# Patient Record
Sex: Male | Born: 1955 | Race: White | Hispanic: No | Marital: Married
Health system: Southern US, Community
[De-identification: ages and names within clinical notes are randomized; demographics above are authoritative.]

---

## 2004-01-13 ENCOUNTER — Other Ambulatory Visit: Payer: Self-pay

## 2004-01-14 ENCOUNTER — Inpatient Hospital Stay: Payer: Self-pay | Admitting: General Surgery

## 2004-01-15 ENCOUNTER — Other Ambulatory Visit: Payer: Self-pay

## 2004-01-16 ENCOUNTER — Other Ambulatory Visit: Payer: Self-pay

## 2004-01-24 ENCOUNTER — Other Ambulatory Visit: Payer: Self-pay

## 2004-02-06 ENCOUNTER — Ambulatory Visit: Payer: Self-pay | Admitting: General Surgery

## 2004-02-07 ENCOUNTER — Ambulatory Visit: Payer: Self-pay | Admitting: Internal Medicine

## 2004-02-26 ENCOUNTER — Ambulatory Visit: Payer: Self-pay | Admitting: Internal Medicine

## 2004-03-07 ENCOUNTER — Ambulatory Visit: Payer: Self-pay | Admitting: General Surgery

## 2004-03-09 ENCOUNTER — Ambulatory Visit: Payer: Self-pay | Admitting: General Surgery

## 2004-03-28 ENCOUNTER — Ambulatory Visit: Payer: Self-pay | Admitting: Internal Medicine

## 2004-04-25 ENCOUNTER — Ambulatory Visit: Payer: Self-pay | Admitting: Internal Medicine

## 2004-05-26 ENCOUNTER — Ambulatory Visit: Payer: Self-pay | Admitting: Internal Medicine

## 2004-06-25 ENCOUNTER — Ambulatory Visit: Payer: Self-pay | Admitting: Internal Medicine

## 2004-07-26 ENCOUNTER — Ambulatory Visit: Payer: Self-pay | Admitting: Internal Medicine

## 2004-08-10 ENCOUNTER — Ambulatory Visit: Payer: Self-pay | Admitting: Neurology

## 2004-08-25 ENCOUNTER — Ambulatory Visit: Payer: Self-pay | Admitting: Internal Medicine

## 2004-09-25 ENCOUNTER — Ambulatory Visit: Payer: Self-pay | Admitting: Internal Medicine

## 2004-10-26 ENCOUNTER — Ambulatory Visit: Payer: Self-pay | Admitting: Internal Medicine

## 2004-11-25 ENCOUNTER — Ambulatory Visit: Payer: Self-pay | Admitting: Internal Medicine

## 2004-12-26 ENCOUNTER — Ambulatory Visit: Payer: Self-pay | Admitting: Internal Medicine

## 2005-01-25 ENCOUNTER — Ambulatory Visit: Payer: Self-pay | Admitting: Internal Medicine

## 2005-02-25 ENCOUNTER — Ambulatory Visit: Payer: Self-pay | Admitting: Internal Medicine

## 2005-03-20 ENCOUNTER — Other Ambulatory Visit: Payer: Self-pay

## 2005-03-28 ENCOUNTER — Ambulatory Visit: Payer: Self-pay | Admitting: Internal Medicine

## 2005-04-25 ENCOUNTER — Ambulatory Visit: Payer: Self-pay | Admitting: Internal Medicine

## 2005-05-26 ENCOUNTER — Ambulatory Visit: Payer: Self-pay | Admitting: Internal Medicine

## 2005-06-25 ENCOUNTER — Ambulatory Visit: Payer: Self-pay | Admitting: Internal Medicine

## 2005-07-26 ENCOUNTER — Ambulatory Visit: Payer: Self-pay | Admitting: Internal Medicine

## 2005-08-25 ENCOUNTER — Ambulatory Visit: Payer: Self-pay | Admitting: Internal Medicine

## 2005-09-25 ENCOUNTER — Ambulatory Visit: Payer: Self-pay | Admitting: Internal Medicine

## 2005-10-26 ENCOUNTER — Ambulatory Visit: Payer: Self-pay | Admitting: Internal Medicine

## 2005-11-25 ENCOUNTER — Ambulatory Visit: Payer: Self-pay | Admitting: Internal Medicine

## 2005-12-26 ENCOUNTER — Ambulatory Visit: Payer: Self-pay | Admitting: Internal Medicine

## 2006-01-25 ENCOUNTER — Ambulatory Visit: Payer: Self-pay | Admitting: Internal Medicine

## 2006-02-25 ENCOUNTER — Ambulatory Visit: Payer: Self-pay | Admitting: Internal Medicine

## 2006-03-28 ENCOUNTER — Ambulatory Visit: Payer: Self-pay | Admitting: Internal Medicine

## 2006-03-28 ENCOUNTER — Emergency Department: Payer: Self-pay | Admitting: Emergency Medicine

## 2006-04-08 ENCOUNTER — Ambulatory Visit: Payer: Self-pay | Admitting: General Surgery

## 2006-04-23 ENCOUNTER — Inpatient Hospital Stay: Payer: Self-pay | Admitting: General Surgery

## 2006-04-26 ENCOUNTER — Ambulatory Visit: Payer: Self-pay | Admitting: Internal Medicine

## 2006-05-27 ENCOUNTER — Ambulatory Visit: Payer: Self-pay | Admitting: Internal Medicine

## 2006-06-26 ENCOUNTER — Ambulatory Visit: Payer: Self-pay | Admitting: Internal Medicine

## 2006-07-27 ENCOUNTER — Ambulatory Visit: Payer: Self-pay | Admitting: Internal Medicine

## 2006-08-26 ENCOUNTER — Ambulatory Visit: Payer: Self-pay | Admitting: Internal Medicine

## 2006-09-26 ENCOUNTER — Ambulatory Visit: Payer: Self-pay | Admitting: Internal Medicine

## 2006-10-27 ENCOUNTER — Ambulatory Visit: Payer: Self-pay | Admitting: Internal Medicine

## 2006-11-11 ENCOUNTER — Emergency Department: Payer: Self-pay | Admitting: Emergency Medicine

## 2006-11-26 ENCOUNTER — Ambulatory Visit: Payer: Self-pay | Admitting: Internal Medicine

## 2006-12-27 ENCOUNTER — Ambulatory Visit: Payer: Self-pay | Admitting: Internal Medicine

## 2007-01-26 ENCOUNTER — Ambulatory Visit: Payer: Self-pay | Admitting: Internal Medicine

## 2007-02-26 ENCOUNTER — Ambulatory Visit: Payer: Self-pay | Admitting: Internal Medicine

## 2007-03-29 ENCOUNTER — Ambulatory Visit: Payer: Self-pay | Admitting: Internal Medicine

## 2007-04-26 ENCOUNTER — Ambulatory Visit: Payer: Self-pay | Admitting: Internal Medicine

## 2007-05-27 ENCOUNTER — Ambulatory Visit: Payer: Self-pay | Admitting: Internal Medicine

## 2007-06-26 ENCOUNTER — Ambulatory Visit: Payer: Self-pay | Admitting: Internal Medicine

## 2007-07-27 ENCOUNTER — Ambulatory Visit: Payer: Self-pay | Admitting: Internal Medicine

## 2007-08-26 ENCOUNTER — Ambulatory Visit: Payer: Self-pay | Admitting: Internal Medicine

## 2007-09-22 ENCOUNTER — Ambulatory Visit: Payer: Self-pay | Admitting: General Surgery

## 2007-09-26 ENCOUNTER — Ambulatory Visit: Payer: Self-pay | Admitting: Internal Medicine

## 2007-10-27 ENCOUNTER — Ambulatory Visit: Payer: Self-pay | Admitting: Internal Medicine

## 2007-11-26 ENCOUNTER — Ambulatory Visit: Payer: Self-pay | Admitting: Internal Medicine

## 2007-12-15 ENCOUNTER — Ambulatory Visit: Payer: Self-pay | Admitting: Internal Medicine

## 2007-12-27 ENCOUNTER — Ambulatory Visit: Payer: Self-pay | Admitting: Internal Medicine

## 2008-01-26 ENCOUNTER — Ambulatory Visit: Payer: Self-pay | Admitting: Internal Medicine

## 2008-02-17 ENCOUNTER — Ambulatory Visit: Payer: Self-pay | Admitting: General Surgery

## 2008-02-24 ENCOUNTER — Ambulatory Visit: Payer: Self-pay | Admitting: General Surgery

## 2008-02-26 ENCOUNTER — Ambulatory Visit: Payer: Self-pay | Admitting: Internal Medicine

## 2008-03-28 ENCOUNTER — Ambulatory Visit: Payer: Self-pay | Admitting: Internal Medicine

## 2008-04-25 ENCOUNTER — Ambulatory Visit: Payer: Self-pay | Admitting: Internal Medicine

## 2008-05-26 ENCOUNTER — Ambulatory Visit: Payer: Self-pay | Admitting: Internal Medicine

## 2008-06-25 ENCOUNTER — Ambulatory Visit: Payer: Self-pay | Admitting: Internal Medicine

## 2008-07-26 ENCOUNTER — Ambulatory Visit: Payer: Self-pay | Admitting: Internal Medicine

## 2008-08-25 ENCOUNTER — Ambulatory Visit: Payer: Self-pay | Admitting: Internal Medicine

## 2008-09-15 ENCOUNTER — Emergency Department: Payer: Self-pay | Admitting: Emergency Medicine

## 2008-09-25 ENCOUNTER — Ambulatory Visit: Payer: Self-pay | Admitting: Internal Medicine

## 2008-10-26 ENCOUNTER — Ambulatory Visit: Payer: Self-pay | Admitting: Internal Medicine

## 2008-11-25 ENCOUNTER — Ambulatory Visit: Payer: Self-pay | Admitting: Internal Medicine

## 2008-12-26 ENCOUNTER — Ambulatory Visit: Payer: Self-pay | Admitting: Internal Medicine

## 2009-01-25 ENCOUNTER — Ambulatory Visit: Payer: Self-pay | Admitting: Internal Medicine

## 2009-02-25 ENCOUNTER — Ambulatory Visit: Payer: Self-pay | Admitting: Internal Medicine

## 2009-03-28 ENCOUNTER — Ambulatory Visit: Payer: Self-pay | Admitting: Internal Medicine

## 2009-04-25 ENCOUNTER — Ambulatory Visit: Payer: Self-pay | Admitting: Internal Medicine

## 2009-05-26 ENCOUNTER — Ambulatory Visit: Payer: Self-pay | Admitting: Internal Medicine

## 2009-06-25 ENCOUNTER — Ambulatory Visit: Payer: Self-pay | Admitting: Internal Medicine

## 2009-07-26 ENCOUNTER — Ambulatory Visit: Payer: Self-pay | Admitting: Internal Medicine

## 2009-08-02 ENCOUNTER — Ambulatory Visit: Payer: Self-pay | Admitting: General Surgery

## 2009-08-23 ENCOUNTER — Inpatient Hospital Stay: Payer: Self-pay | Admitting: General Surgery

## 2009-08-25 ENCOUNTER — Ambulatory Visit: Payer: Self-pay | Admitting: Internal Medicine

## 2009-09-22 LAB — CEA: CEA: 2.9 ng/mL (ref 0.0–4.7)

## 2009-09-25 ENCOUNTER — Ambulatory Visit: Payer: Self-pay | Admitting: Internal Medicine

## 2009-10-26 ENCOUNTER — Ambulatory Visit: Payer: Self-pay | Admitting: Internal Medicine

## 2009-11-25 ENCOUNTER — Ambulatory Visit: Payer: Self-pay | Admitting: Internal Medicine

## 2009-12-23 LAB — CEA: CEA: 12.9 ng/mL — ABNORMAL HIGH (ref 0.0–4.7)

## 2009-12-26 ENCOUNTER — Ambulatory Visit: Payer: Self-pay | Admitting: Internal Medicine

## 2010-01-17 LAB — CEA: CEA: 17.5 ng/mL — ABNORMAL HIGH (ref 0.0–4.7)

## 2010-01-25 ENCOUNTER — Ambulatory Visit: Payer: Self-pay | Admitting: Internal Medicine

## 2010-02-13 LAB — CEA: CEA: 23.4 ng/mL — ABNORMAL HIGH (ref 0.0–4.7)

## 2010-02-25 ENCOUNTER — Ambulatory Visit: Payer: Self-pay | Admitting: Internal Medicine

## 2010-03-23 ENCOUNTER — Ambulatory Visit: Payer: Self-pay | Admitting: General Surgery

## 2010-03-26 ENCOUNTER — Ambulatory Visit: Payer: Self-pay | Admitting: General Surgery

## 2010-03-28 ENCOUNTER — Ambulatory Visit: Payer: Self-pay | Admitting: Internal Medicine

## 2010-04-06 LAB — CEA: CEA: 24.1 ng/mL — ABNORMAL HIGH (ref 0.0–4.7)

## 2010-04-20 LAB — CEA: CEA: 16.8 ng/mL — ABNORMAL HIGH (ref 0.0–4.7)

## 2010-04-26 ENCOUNTER — Ambulatory Visit: Payer: Self-pay | Admitting: Internal Medicine

## 2010-05-04 LAB — CEA: CEA: 14.9 ng/mL — ABNORMAL HIGH (ref 0.0–4.7)

## 2010-05-18 LAB — CEA: CEA: 12 ng/mL — ABNORMAL HIGH (ref 0.0–4.7)

## 2010-05-27 ENCOUNTER — Ambulatory Visit: Payer: Self-pay | Admitting: Internal Medicine

## 2010-06-01 LAB — CEA: CEA: 11 ng/mL — ABNORMAL HIGH (ref 0.0–4.7)

## 2010-06-26 ENCOUNTER — Ambulatory Visit: Payer: Self-pay | Admitting: Internal Medicine

## 2010-07-06 LAB — CEA: CEA: 6 ng/mL — ABNORMAL HIGH (ref 0.0–4.7)

## 2010-07-27 ENCOUNTER — Ambulatory Visit: Payer: Self-pay | Admitting: Internal Medicine

## 2010-08-03 LAB — CEA: CEA: 4.2 ng/mL (ref 0.0–4.7)

## 2010-08-26 ENCOUNTER — Ambulatory Visit: Payer: Self-pay | Admitting: Internal Medicine

## 2010-09-21 LAB — CEA: CEA: 4.1 ng/mL (ref 0.0–4.7)

## 2010-09-26 ENCOUNTER — Ambulatory Visit: Payer: Self-pay | Admitting: Internal Medicine

## 2010-10-01 ENCOUNTER — Emergency Department: Payer: Self-pay | Admitting: Emergency Medicine

## 2010-10-05 LAB — CEA: CEA: 3.3 ng/mL (ref 0.0–4.7)

## 2010-10-11 ENCOUNTER — Inpatient Hospital Stay: Payer: Self-pay | Admitting: Internal Medicine

## 2010-10-27 ENCOUNTER — Ambulatory Visit: Payer: Self-pay | Admitting: Internal Medicine

## 2010-11-26 ENCOUNTER — Ambulatory Visit: Payer: Self-pay | Admitting: Internal Medicine

## 2010-12-05 LAB — CEA: CEA: 6.9 ng/mL — ABNORMAL HIGH (ref 0.0–4.7)

## 2010-12-18 LAB — CEA: CEA: 6.9 ng/mL — ABNORMAL HIGH (ref 0.0–4.7)

## 2010-12-27 ENCOUNTER — Ambulatory Visit: Payer: Self-pay | Admitting: Internal Medicine

## 2011-01-01 LAB — CEA: CEA: 6 ng/mL — ABNORMAL HIGH (ref 0.0–4.7)

## 2011-01-22 LAB — CEA: CEA: 5.5 ng/mL — ABNORMAL HIGH (ref 0.0–4.7)

## 2011-01-26 ENCOUNTER — Ambulatory Visit: Payer: Self-pay | Admitting: Internal Medicine

## 2011-01-29 ENCOUNTER — Ambulatory Visit: Payer: Self-pay | Admitting: General Surgery

## 2011-02-13 ENCOUNTER — Inpatient Hospital Stay: Payer: Self-pay | Admitting: General Surgery

## 2011-02-15 LAB — PATHOLOGY REPORT

## 2011-02-22 ENCOUNTER — Other Ambulatory Visit: Payer: Self-pay | Admitting: General Surgery

## 2011-02-25 ENCOUNTER — Other Ambulatory Visit: Payer: Self-pay | Admitting: General Surgery

## 2011-02-26 ENCOUNTER — Ambulatory Visit: Payer: Self-pay | Admitting: Internal Medicine

## 2011-02-28 ENCOUNTER — Other Ambulatory Visit: Payer: Self-pay | Admitting: General Surgery

## 2011-02-28 LAB — PROTIME-INR
INR: 2.1
Prothrombin Time: 22.8 secs — ABNORMAL HIGH (ref 11.5–14.7)

## 2011-03-06 LAB — CBC CANCER CENTER
Basophil #: 0 x10 3/mm (ref 0.0–0.1)
Basophil %: 0.6 %
Eosinophil #: 0.3 x10 3/mm (ref 0.0–0.7)
HCT: 35.5 % — ABNORMAL LOW (ref 40.0–52.0)
HGB: 11.9 g/dL — ABNORMAL LOW (ref 13.0–18.0)
Lymphocyte #: 1.6 x10 3/mm (ref 1.0–3.6)
Lymphocyte %: 29.8 %
MCH: 25.1 pg — ABNORMAL LOW (ref 26.0–34.0)
MCHC: 33.5 g/dL (ref 32.0–36.0)
MCV: 75 fL — ABNORMAL LOW (ref 80–100)
Monocyte #: 0.5 x10 3/mm (ref 0.0–0.7)
Neutrophil #: 2.9 x10 3/mm (ref 1.4–6.5)
RBC: 4.73 10*6/uL (ref 4.40–5.90)
RDW: 17.8 % — ABNORMAL HIGH (ref 11.5–14.5)
WBC: 5.3 x10 3/mm (ref 3.8–10.6)

## 2011-03-06 LAB — COMPREHENSIVE METABOLIC PANEL
Albumin: 3.4 g/dL (ref 3.4–5.0)
Anion Gap: 8 (ref 7–16)
Bilirubin,Total: 0.3 mg/dL (ref 0.2–1.0)
Calcium, Total: 9.5 mg/dL (ref 8.5–10.1)
Chloride: 108 mmol/L — ABNORMAL HIGH (ref 98–107)
Co2: 30 mmol/L (ref 21–32)
EGFR (African American): >60
EGFR (Non-African Amer.): >60
Osmolality: 290 (ref 275–301)
Potassium: 5 mmol/L (ref 3.5–5.1)
Sodium: 146 mmol/L — ABNORMAL HIGH (ref 136–145)

## 2011-03-06 LAB — PROTIME-INR
INR: 2.1
Prothrombin Time: 23.7 secs — ABNORMAL HIGH (ref 11.5–14.7)

## 2011-03-07 LAB — CEA: CEA: 9.7 ng/mL — ABNORMAL HIGH (ref 0.0–4.7)

## 2011-03-14 LAB — PROTIME-INR: INR: 2.3

## 2011-03-14 LAB — POTASSIUM: Potassium: 4.5 mmol/L (ref 3.5–5.1)

## 2011-03-21 LAB — POTASSIUM: Potassium: 4.7 mmol/L (ref 3.5–5.1)

## 2011-03-21 LAB — PROTIME-INR
INR: 2.4
Prothrombin Time: 26.6 secs — ABNORMAL HIGH (ref 11.5–14.7)

## 2011-03-22 LAB — CEA: CEA: 11.8 ng/mL — ABNORMAL HIGH (ref 0.0–4.7)

## 2011-03-28 LAB — CBC CANCER CENTER
Basophil #: 0 x10 3/mm (ref 0.0–0.1)
Basophil %: 0.7 %
Eosinophil #: 0.2 x10 3/mm (ref 0.0–0.7)
Eosinophil %: 5.2 %
HCT: 34.3 % — ABNORMAL LOW (ref 40.0–52.0)
HGB: 11.5 g/dL — ABNORMAL LOW (ref 13.0–18.0)
Lymphocyte #: 1.5 x10 3/mm (ref 1.0–3.6)
Lymphocyte %: 39 %
MCH: 25 pg — ABNORMAL LOW (ref 26.0–34.0)
MCHC: 33.5 g/dL (ref 32.0–36.0)
MCV: 75 fL — ABNORMAL LOW (ref 80–100)
Monocyte #: 0.4 x10 3/mm (ref 0.0–0.7)
Monocyte %: 10.9 %
Neutrophil #: 1.7 x10 3/mm (ref 1.4–6.5)
Neutrophil %: 44.2 %
Platelet: 249 x10 3/mm (ref 150–440)
RBC: 4.6 10*6/uL (ref 4.40–5.90)
RDW: 17.8 % — ABNORMAL HIGH (ref 11.5–14.5)
WBC: 3.9 x10 3/mm (ref 3.8–10.6)

## 2011-03-28 LAB — COMPREHENSIVE METABOLIC PANEL
Albumin: 3.7 g/dL (ref 3.4–5.0)
Alkaline Phosphatase: 94 U/L (ref 50–136)
Anion Gap: 10 (ref 7–16)
BUN: 10 mg/dL (ref 7–18)
Bilirubin,Total: 0.5 mg/dL (ref 0.2–1.0)
Calcium, Total: 8.7 mg/dL (ref 8.5–10.1)
Chloride: 104 mmol/L (ref 98–107)
Co2: 27 mmol/L (ref 21–32)
Creatinine: 0.89 mg/dL (ref 0.60–1.30)
EGFR (African American): >60
EGFR (Non-African Amer.): >60
Glucose: 122 mg/dL — ABNORMAL HIGH (ref 65–99)
Osmolality: 282 (ref 275–301)
Potassium: 4 mmol/L (ref 3.5–5.1)
SGOT(AST): 25 U/L (ref 15–37)
SGPT (ALT): 25 U/L
Sodium: 141 mmol/L (ref 136–145)
Total Protein: 7.1 g/dL (ref 6.4–8.2)

## 2011-03-28 LAB — PROTIME-INR
INR: 2.1
Prothrombin Time: 24.1 secs — ABNORMAL HIGH (ref 11.5–14.7)

## 2011-03-29 ENCOUNTER — Ambulatory Visit: Payer: Self-pay | Admitting: Internal Medicine

## 2011-04-04 LAB — CBC CANCER CENTER
Basophil #: 0 x10 3/mm (ref 0.0–0.1)
Basophil %: 0.6 %
Eosinophil %: 3.3 %
HCT: 34.4 % — ABNORMAL LOW (ref 40.0–52.0)
HGB: 11.8 g/dL — ABNORMAL LOW (ref 13.0–18.0)
Lymphocyte #: 1.6 x10 3/mm (ref 1.0–3.6)
MCH: 25.7 pg — ABNORMAL LOW (ref 26.0–34.0)
Monocyte #: 0.5 x10 3/mm (ref 0.0–0.7)
Monocyte %: 9.2 %
RBC: 4.6 10*6/uL (ref 4.40–5.90)
WBC: 4.9 x10 3/mm (ref 3.8–10.6)

## 2011-04-11 LAB — HEPATIC FUNCTION PANEL A (ARMC)
Albumin: 3.4 g/dL (ref 3.4–5.0)
Alkaline Phosphatase: 90 U/L (ref 50–136)
Bilirubin, Direct: 0.1 mg/dL (ref 0.00–0.20)
Bilirubin,Total: 0.3 mg/dL (ref 0.2–1.0)
Total Protein: 6.6 g/dL (ref 6.4–8.2)

## 2011-04-11 LAB — PROTIME-INR
INR: 2.1
Prothrombin Time: 23.7 secs — ABNORMAL HIGH (ref 11.5–14.7)

## 2011-04-11 LAB — CBC CANCER CENTER
Basophil #: 0 x10 3/mm (ref 0.0–0.1)
Eosinophil %: 3.3 %
HGB: 11 g/dL — ABNORMAL LOW (ref 13.0–18.0)
Monocyte #: 0.4 x10 3/mm (ref 0.0–0.7)
Monocyte %: 10 %
Neutrophil %: 56.3 %
Platelet: 258 x10 3/mm (ref 150–440)
RBC: 4.36 10*6/uL — ABNORMAL LOW (ref 4.40–5.90)
RDW: 17.9 % — ABNORMAL HIGH (ref 11.5–14.5)
WBC: 4.2 x10 3/mm (ref 3.8–10.6)

## 2011-04-11 LAB — CREATININE, SERUM
Creatinine: 0.96 mg/dL (ref 0.60–1.30)
EGFR (African American): >60
EGFR (Non-African Amer.): >60

## 2011-04-18 LAB — CBC CANCER CENTER
Basophil #: 0 x10 3/mm (ref 0.0–0.1)
Basophil %: 0.5 %
Eosinophil #: 0.2 x10 3/mm (ref 0.0–0.7)
Eosinophil %: 4.2 %
HCT: 36.6 % — ABNORMAL LOW (ref 40.0–52.0)
HGB: 12.3 g/dL — ABNORMAL LOW (ref 13.0–18.0)
Lymphocyte %: 38.7 %
MCH: 25.2 pg — ABNORMAL LOW (ref 26.0–34.0)
MCV: 75 fL — ABNORMAL LOW (ref 80–100)
Neutrophil #: 2 x10 3/mm (ref 1.4–6.5)
Neutrophil %: 48.6 %
Platelet: 278 x10 3/mm (ref 150–440)
RBC: 4.89 10*6/uL (ref 4.40–5.90)
RDW: 17.1 % — ABNORMAL HIGH (ref 11.5–14.5)
WBC: 4.1 x10 3/mm (ref 3.8–10.6)

## 2011-04-19 LAB — CEA: CEA: 18 ng/mL — ABNORMAL HIGH (ref 0.0–4.7)

## 2011-04-25 LAB — CBC CANCER CENTER
Basophil #: 0 x10 3/mm (ref 0.0–0.1)
Basophil %: 0.4 %
Eosinophil #: 0.1 x10 3/mm (ref 0.0–0.7)
Eosinophil %: 2.9 %
HCT: 35.9 % — ABNORMAL LOW (ref 40.0–52.0)
Lymphocyte %: 31 %
MCH: 25.1 pg — ABNORMAL LOW (ref 26.0–34.0)
MCHC: 33.6 g/dL (ref 32.0–36.0)
MCV: 75 fL — ABNORMAL LOW (ref 80–100)
Monocyte #: 0.5 x10 3/mm (ref 0.0–0.7)
Neutrophil %: 54.5 %
Platelet: 217 x10 3/mm (ref 150–440)
RDW: 18 % — ABNORMAL HIGH (ref 11.5–14.5)
WBC: 4.8 x10 3/mm (ref 3.8–10.6)

## 2011-04-25 LAB — HEPATIC FUNCTION PANEL A (ARMC)
Albumin: 3.6 g/dL (ref 3.4–5.0)
Bilirubin, Direct: 0.1 mg/dL (ref 0.00–0.20)
Bilirubin,Total: 0.3 mg/dL (ref 0.2–1.0)
SGPT (ALT): 28 U/L
Total Protein: 7.2 g/dL (ref 6.4–8.2)

## 2011-04-25 LAB — PROTIME-INR: Prothrombin Time: 20.4 secs — ABNORMAL HIGH (ref 11.5–14.7)

## 2011-04-25 LAB — CREATININE, SERUM
EGFR (African American): >60
EGFR (Non-African Amer.): >60

## 2011-04-26 ENCOUNTER — Ambulatory Visit: Payer: Self-pay | Admitting: Internal Medicine

## 2011-05-02 LAB — CBC CANCER CENTER
Basophil %: 0.6 %
Eosinophil #: 0.2 x10 3/mm (ref 0.0–0.7)
Eosinophil %: 5 %
HCT: 35.5 % — ABNORMAL LOW (ref 40.0–52.0)
HGB: 12.1 g/dL — ABNORMAL LOW (ref 13.0–18.0)
Lymphocyte #: 1.3 x10 3/mm (ref 1.0–3.6)
MCH: 25.4 pg — ABNORMAL LOW (ref 26.0–34.0)
MCHC: 34 g/dL (ref 32.0–36.0)
MCV: 75 fL — ABNORMAL LOW (ref 80–100)
Monocyte #: 0.3 x10 3/mm (ref 0.0–0.7)
Monocyte %: 9.4 %
Neutrophil #: 1.6 x10 3/mm (ref 1.4–6.5)
RBC: 4.74 10*6/uL (ref 4.40–5.90)
RDW: 17.6 % — ABNORMAL HIGH (ref 11.5–14.5)
WBC: 3.3 x10 3/mm — ABNORMAL LOW (ref 3.8–10.6)

## 2011-05-03 LAB — CEA: CEA: 16.2 ng/mL — ABNORMAL HIGH (ref 0.0–4.7)

## 2011-05-09 LAB — CBC CANCER CENTER
Basophil #: 0 x10 3/mm (ref 0.0–0.1)
Basophil %: 0.6 %
Eosinophil #: 0.2 x10 3/mm (ref 0.0–0.7)
HCT: 35.2 % — ABNORMAL LOW (ref 40.0–52.0)
Lymphocyte %: 26 %
MCH: 25 pg — ABNORMAL LOW (ref 26.0–34.0)
MCHC: 33.3 g/dL (ref 32.0–36.0)
Monocyte #: 0.5 x10 3/mm (ref 0.0–0.7)
Monocyte %: 9.8 %
Neutrophil #: 2.8 x10 3/mm (ref 1.4–6.5)
Neutrophil %: 60.3 %
Platelet: 228 x10 3/mm (ref 150–440)
RBC: 4.69 10*6/uL (ref 4.40–5.90)
RDW: 18 % — ABNORMAL HIGH (ref 11.5–14.5)
WBC: 4.6 x10 3/mm (ref 3.8–10.6)

## 2011-05-09 LAB — HEPATIC FUNCTION PANEL A (ARMC)
Albumin: 3.5 g/dL (ref 3.4–5.0)
Bilirubin, Direct: 0.1 mg/dL (ref 0.00–0.20)
Bilirubin,Total: 0.4 mg/dL (ref 0.2–1.0)
SGOT(AST): 31 U/L (ref 15–37)
SGPT (ALT): 27 U/L
Total Protein: 6.9 g/dL (ref 6.4–8.2)

## 2011-05-09 LAB — CREATININE, SERUM
Creatinine: 1.16 mg/dL (ref 0.60–1.30)
EGFR (Non-African Amer.): >60

## 2011-05-09 LAB — PROTIME-INR: Prothrombin Time: 21.3 secs — ABNORMAL HIGH (ref 11.5–14.7)

## 2011-05-09 LAB — POTASSIUM: Potassium: 3.9 mmol/L (ref 3.5–5.1)

## 2011-05-16 LAB — CBC CANCER CENTER
Basophil %: 0.6 %
Eosinophil #: 0.2 x10 3/mm (ref 0.0–0.7)
Eosinophil %: 5.8 %
HCT: 33.7 % — ABNORMAL LOW (ref 40.0–52.0)
HGB: 11.4 g/dL — ABNORMAL LOW (ref 13.0–18.0)
Lymphocyte %: 32.3 %
MCH: 25.4 pg — ABNORMAL LOW (ref 26.0–34.0)
Monocyte #: 0.3 x10 3/mm (ref 0.0–0.7)
Neutrophil #: 1.7 x10 3/mm (ref 1.4–6.5)
Neutrophil %: 51.2 %
RBC: 4.49 10*6/uL (ref 4.40–5.90)

## 2011-05-17 LAB — CEA: CEA: 15.1 ng/mL — ABNORMAL HIGH (ref 0.0–4.7)

## 2011-05-23 LAB — CREATININE, SERUM
Creatinine: 1.13 mg/dL (ref 0.60–1.30)
EGFR (African American): >60
EGFR (Non-African Amer.): >60

## 2011-05-23 LAB — CBC CANCER CENTER
Basophil #: 0 x10 3/mm (ref 0.0–0.1)
Basophil %: 0.5 %
Eosinophil %: 3.7 %
HCT: 34.2 % — ABNORMAL LOW (ref 40.0–52.0)
HGB: 11.5 g/dL — ABNORMAL LOW (ref 13.0–18.0)
Lymphocyte #: 1.5 x10 3/mm (ref 1.0–3.6)
Lymphocyte %: 31.3 %
MCHC: 33.7 g/dL (ref 32.0–36.0)
Neutrophil #: 2.5 x10 3/mm (ref 1.4–6.5)
Neutrophil %: 53.3 %
RBC: 4.55 10*6/uL (ref 4.40–5.90)
WBC: 4.8 x10 3/mm (ref 3.8–10.6)

## 2011-05-23 LAB — HEPATIC FUNCTION PANEL A (ARMC)
Bilirubin, Direct: 0.1 mg/dL (ref 0.00–0.20)
Bilirubin,Total: 0.4 mg/dL (ref 0.2–1.0)
SGOT(AST): 23 U/L (ref 15–37)
SGPT (ALT): 30 U/L

## 2011-05-23 LAB — MAGNESIUM: Magnesium: 1.8 mg/dL

## 2011-05-23 LAB — PROTIME-INR
INR: 1.7
Prothrombin Time: 19.9 secs — ABNORMAL HIGH (ref 11.5–14.7)

## 2011-05-27 ENCOUNTER — Ambulatory Visit: Payer: Self-pay | Admitting: Internal Medicine

## 2011-05-28 LAB — COMPREHENSIVE METABOLIC PANEL
Alkaline Phosphatase: 93 U/L (ref 50–136)
Anion Gap: 9 (ref 7–16)
BUN: 16 mg/dL (ref 7–18)
Bilirubin,Total: 0.6 mg/dL (ref 0.2–1.0)
Calcium, Total: 9.2 mg/dL (ref 8.5–10.1)
Co2: 30 mmol/L (ref 21–32)
EGFR (African American): >60
Glucose: 107 mg/dL — ABNORMAL HIGH (ref 65–99)
Potassium: 4.8 mmol/L (ref 3.5–5.1)
SGOT(AST): 32 U/L (ref 15–37)
SGPT (ALT): 30 U/L
Sodium: 144 mmol/L (ref 136–145)
Total Protein: 6.8 g/dL (ref 6.4–8.2)

## 2011-05-28 LAB — CBC
HCT: 35.3 % — ABNORMAL LOW (ref 40.0–52.0)
HGB: 11.8 g/dL — ABNORMAL LOW (ref 13.0–18.0)
MCH: 25.4 pg — ABNORMAL LOW (ref 26.0–34.0)
MCHC: 33.5 g/dL (ref 32.0–36.0)
MCV: 76 fL — ABNORMAL LOW (ref 80–100)
RBC: 4.64 10*6/uL (ref 4.40–5.90)
WBC: 4.7 10*3/uL (ref 3.8–10.6)

## 2011-05-29 ENCOUNTER — Inpatient Hospital Stay: Payer: Self-pay | Admitting: Internal Medicine

## 2011-05-29 LAB — PROTIME-INR: Prothrombin Time: 28.4 secs — ABNORMAL HIGH (ref 11.5–14.7)

## 2011-05-30 LAB — BASIC METABOLIC PANEL
Anion Gap: 8 (ref 7–16)
BUN: 15 mg/dL (ref 7–18)
Calcium, Total: 8 mg/dL — ABNORMAL LOW (ref 8.5–10.1)
Chloride: 109 mmol/L — ABNORMAL HIGH (ref 98–107)
Co2: 25 mmol/L (ref 21–32)
Creatinine: 1.71 mg/dL — ABNORMAL HIGH (ref 0.60–1.30)
EGFR (African American): 54 — ABNORMAL LOW
Osmolality: 283 (ref 275–301)
Potassium: 3.5 mmol/L (ref 3.5–5.1)

## 2011-05-30 LAB — CBC WITH DIFFERENTIAL/PLATELET
Basophil %: 0.5 %
Eosinophil %: 5.6 %
HCT: 27.7 % — ABNORMAL LOW (ref 40.0–52.0)
Lymphocyte #: 0.9 10*3/uL — ABNORMAL LOW (ref 1.0–3.6)
Lymphocyte %: 33.5 %
MCV: 76 fL — ABNORMAL LOW (ref 80–100)
Monocyte #: 0.3 10*3/uL (ref 0.0–0.7)
Monocyte %: 12 %
Neutrophil #: 1.3 10*3/uL — ABNORMAL LOW (ref 1.4–6.5)
Neutrophil %: 48.4 %
Platelet: 200 10*3/uL (ref 150–440)
RBC: 3.66 10*6/uL — ABNORMAL LOW (ref 4.40–5.90)
RDW: 19.4 % — ABNORMAL HIGH (ref 11.5–14.5)
WBC: 2.7 10*3/uL — ABNORMAL LOW (ref 3.8–10.6)

## 2011-05-30 LAB — PROTIME-INR
INR: 3.4
Prothrombin Time: 34.7 secs — ABNORMAL HIGH (ref 11.5–14.7)

## 2011-05-30 LAB — CREATININE, SERUM: Creatinine: 1.08 mg/dL (ref 0.60–1.30)

## 2011-05-30 LAB — POTASSIUM: Potassium: 3.7 mmol/L (ref 3.5–5.1)

## 2011-05-31 LAB — CBC WITH DIFFERENTIAL/PLATELET
Basophil #: 0 10*3/uL (ref 0.0–0.1)
Eosinophil #: 0.2 10*3/uL (ref 0.0–0.7)
Eosinophil %: 6 %
HCT: 30.1 % — ABNORMAL LOW (ref 40.0–52.0)
Lymphocyte #: 0.9 10*3/uL — ABNORMAL LOW (ref 1.0–3.6)
Lymphocyte %: 25.8 %
MCH: 25.1 pg — ABNORMAL LOW (ref 26.0–34.0)
MCHC: 33.4 g/dL (ref 32.0–36.0)
Monocyte %: 13.2 %
Neutrophil #: 1.8 10*3/uL (ref 1.4–6.5)
RBC: 4.01 10*6/uL — ABNORMAL LOW (ref 4.40–5.90)
RDW: 19.4 % — ABNORMAL HIGH (ref 11.5–14.5)
WBC: 3.4 10*3/uL — ABNORMAL LOW (ref 3.8–10.6)

## 2011-05-31 LAB — BASIC METABOLIC PANEL
Anion Gap: 10 (ref 7–16)
Calcium, Total: 8.3 mg/dL — ABNORMAL LOW (ref 8.5–10.1)
Creatinine: 0.97 mg/dL (ref 0.60–1.30)
EGFR (African American): >60
EGFR (Non-African Amer.): >60
Glucose: 85 mg/dL (ref 65–99)
Potassium: 3.8 mmol/L (ref 3.5–5.1)
Sodium: 144 mmol/L (ref 136–145)

## 2011-05-31 LAB — CEA: CEA: 11.9 ng/mL — ABNORMAL HIGH (ref 0.0–4.7)

## 2011-05-31 LAB — PROTIME-INR
INR: 3.4
Prothrombin Time: 34.1 secs — ABNORMAL HIGH (ref 11.5–14.7)

## 2011-06-03 LAB — CBC CANCER CENTER
Basophil #: 0 x10 3/mm (ref 0.0–0.1)
Eosinophil #: 0.2 x10 3/mm (ref 0.0–0.7)
HCT: 34 % — ABNORMAL LOW (ref 40.0–52.0)
Lymphocyte #: 1.6 x10 3/mm (ref 1.0–3.6)
MCHC: 34 g/dL (ref 32.0–36.0)
Monocyte #: 0.6 x10 3/mm (ref 0.0–0.7)
Monocyte %: 12.5 %
Neutrophil #: 2.3 x10 3/mm (ref 1.4–6.5)
RDW: 20 % — ABNORMAL HIGH (ref 11.5–14.5)

## 2011-06-03 LAB — PROTIME-INR: Prothrombin Time: 16.8 secs — ABNORMAL HIGH (ref 11.5–14.7)

## 2011-06-05 LAB — COMPREHENSIVE METABOLIC PANEL
Albumin: 3.9 g/dL (ref 3.4–5.0)
Alkaline Phosphatase: 85 U/L (ref 50–136)
Bilirubin,Total: 0.3 mg/dL (ref 0.2–1.0)
Calcium, Total: 9.3 mg/dL (ref 8.5–10.1)
Chloride: 107 mmol/L (ref 98–107)
Co2: 28 mmol/L (ref 21–32)
Creatinine: 1.13 mg/dL (ref 0.60–1.30)
EGFR (African American): >60
EGFR (Non-African Amer.): >60
Osmolality: 289 (ref 275–301)
SGPT (ALT): 22 U/L
Total Protein: 7.2 g/dL (ref 6.4–8.2)

## 2011-06-05 LAB — CBC CANCER CENTER
Basophil %: 0.8 %
HCT: 35.5 % — ABNORMAL LOW (ref 40.0–52.0)
HGB: 11.9 g/dL — ABNORMAL LOW (ref 13.0–18.0)
Lymphocyte #: 1.4 x10 3/mm (ref 1.0–3.6)
Lymphocyte %: 26.4 %
MCHC: 33.4 g/dL (ref 32.0–36.0)
MCV: 76 fL — ABNORMAL LOW (ref 80–100)
Monocyte %: 10.7 %
Neutrophil #: 3 x10 3/mm (ref 1.4–6.5)
Platelet: 297 x10 3/mm (ref 150–440)
RBC: 4.68 10*6/uL (ref 4.40–5.90)
RDW: 20.2 % — ABNORMAL HIGH (ref 11.5–14.5)
WBC: 5.3 x10 3/mm (ref 3.8–10.6)

## 2011-06-05 LAB — PROTIME-INR
INR: 1.4
Prothrombin Time: 17.8 secs — ABNORMAL HIGH (ref 11.5–14.7)

## 2011-06-06 LAB — CEA: CEA: 14.9 ng/mL — ABNORMAL HIGH (ref 0.0–4.7)

## 2011-06-10 LAB — PROTIME-INR: Prothrombin Time: 19.2 secs — ABNORMAL HIGH (ref 11.5–14.7)

## 2011-06-13 LAB — CREATININE, SERUM
Creatinine: 0.97 mg/dL (ref 0.60–1.30)
EGFR (African American): >60

## 2011-06-13 LAB — CBC CANCER CENTER
Basophil #: 0.1 x10 3/mm (ref 0.0–0.1)
Basophil %: 1.5 %
Eosinophil %: 6.6 %
HCT: 37.3 % — ABNORMAL LOW (ref 40.0–52.0)
HGB: 12.1 g/dL — ABNORMAL LOW (ref 13.0–18.0)
Lymphocyte %: 38.7 %
MCH: 24.6 pg — ABNORMAL LOW (ref 26.0–34.0)
Neutrophil #: 1.7 x10 3/mm (ref 1.4–6.5)
Platelet: 299 x10 3/mm (ref 150–440)
RBC: 4.92 10*6/uL (ref 4.40–5.90)
WBC: 4.2 x10 3/mm (ref 3.8–10.6)

## 2011-06-13 LAB — PROTIME-INR
INR: 1.6
Prothrombin Time: 19.7 secs — ABNORMAL HIGH (ref 11.5–14.7)

## 2011-06-13 LAB — HEPATIC FUNCTION PANEL A (ARMC)
Albumin: 3.8 g/dL (ref 3.4–5.0)
SGOT(AST): 21 U/L (ref 15–37)
SGPT (ALT): 20 U/L

## 2011-06-20 LAB — CBC CANCER CENTER
Basophil %: 1.2 %
HCT: 36.4 % — ABNORMAL LOW (ref 40.0–52.0)
Lymphocyte #: 1.5 x10 3/mm (ref 1.0–3.6)
Lymphocyte %: 34.7 %
MCH: 24.9 pg — ABNORMAL LOW (ref 26.0–34.0)
MCHC: 32.9 g/dL (ref 32.0–36.0)
MCV: 76 fL — ABNORMAL LOW (ref 80–100)
Monocyte #: 0.4 x10 3/mm (ref 0.2–1.0)
Monocyte %: 8.2 %
Neutrophil %: 46.4 %
Platelet: 275 x10 3/mm (ref 150–440)
RBC: 4.82 10*6/uL (ref 4.40–5.90)
RDW: 18.2 % — ABNORMAL HIGH (ref 11.5–14.5)
WBC: 4.4 x10 3/mm (ref 3.8–10.6)

## 2011-06-26 ENCOUNTER — Ambulatory Visit: Payer: Self-pay | Admitting: Internal Medicine

## 2011-06-27 LAB — PROTIME-INR
INR: 1.8
Prothrombin Time: 21.2 secs — ABNORMAL HIGH (ref 11.5–14.7)

## 2011-06-27 LAB — COMPREHENSIVE METABOLIC PANEL
Anion Gap: 7 (ref 7–16)
BUN: 13 mg/dL (ref 7–18)
Calcium, Total: 8.5 mg/dL (ref 8.5–10.1)
Chloride: 107 mmol/L (ref 98–107)
Creatinine: 0.78 mg/dL (ref 0.60–1.30)
EGFR (African American): >60
Glucose: 134 mg/dL — ABNORMAL HIGH (ref 65–99)
SGOT(AST): 22 U/L (ref 15–37)
SGPT (ALT): 25 U/L

## 2011-06-27 LAB — CBC CANCER CENTER
Eosinophil #: 0.3 x10 3/mm (ref 0.0–0.7)
Eosinophil %: 6.2 %
HCT: 35.2 % — ABNORMAL LOW (ref 40.0–52.0)
HGB: 11.6 g/dL — ABNORMAL LOW (ref 13.0–18.0)
Lymphocyte #: 1.3 x10 3/mm (ref 1.0–3.6)
Lymphocyte %: 25.7 %
MCH: 24.9 pg — ABNORMAL LOW (ref 26.0–34.0)

## 2011-07-04 LAB — PROTIME-INR
INR: 1.9
Prothrombin Time: 22 secs — ABNORMAL HIGH (ref 11.5–14.7)

## 2011-07-04 LAB — CBC CANCER CENTER
Basophil #: 0 x10 3/mm (ref 0.0–0.1)
Basophil %: 0.7 %
Eosinophil %: 5.8 %
HCT: 37 % — ABNORMAL LOW (ref 40.0–52.0)
HGB: 12.1 g/dL — ABNORMAL LOW (ref 13.0–18.0)
Lymphocyte #: 1.3 x10 3/mm (ref 1.0–3.6)
MCH: 24.9 pg — ABNORMAL LOW (ref 26.0–34.0)
MCV: 76 fL — ABNORMAL LOW (ref 80–100)
Monocyte #: 0.5 x10 3/mm (ref 0.2–1.0)
Monocyte %: 12.4 %
Neutrophil %: 45.9 %
Platelet: 255 x10 3/mm (ref 150–440)
RBC: 4.86 10*6/uL (ref 4.40–5.90)

## 2011-07-11 LAB — CBC CANCER CENTER
Basophil #: 0 x10 3/mm (ref 0.0–0.1)
Basophil %: 0.8 %
Eosinophil #: 0.2 x10 3/mm (ref 0.0–0.7)
HGB: 11.9 g/dL — ABNORMAL LOW (ref 13.0–18.0)
Lymphocyte #: 1.1 x10 3/mm (ref 1.0–3.6)
Lymphocyte %: 24.4 %
MCH: 25 pg — ABNORMAL LOW (ref 26.0–34.0)
MCHC: 32.2 g/dL (ref 32.0–36.0)
MCV: 78 fL — ABNORMAL LOW (ref 80–100)
Monocyte #: 0.5 x10 3/mm (ref 0.2–1.0)
Neutrophil #: 2.6 x10 3/mm (ref 1.4–6.5)
Platelet: 248 x10 3/mm (ref 150–440)
RBC: 4.77 10*6/uL (ref 4.40–5.90)
WBC: 4.4 x10 3/mm (ref 3.8–10.6)

## 2011-07-11 LAB — PROTIME-INR: Prothrombin Time: 21.4 secs — ABNORMAL HIGH (ref 11.5–14.7)

## 2011-07-16 LAB — CEA: CEA: 14.6 ng/mL — ABNORMAL HIGH

## 2011-07-18 LAB — CBC CANCER CENTER
Basophil #: 0.1 x10 3/mm (ref 0.0–0.1)
Basophil %: 1.4 %
Eosinophil #: 0.2 x10 3/mm (ref 0.0–0.7)
Eosinophil %: 5.1 %
HCT: 37 % — ABNORMAL LOW (ref 40.0–52.0)
HGB: 12.1 g/dL — ABNORMAL LOW (ref 13.0–18.0)
Lymphocyte #: 1.4 x10 3/mm (ref 1.0–3.6)
Lymphocyte %: 33.9 %
MCH: 25.3 pg — ABNORMAL LOW (ref 26.0–34.0)
MCHC: 32.9 g/dL (ref 32.0–36.0)
MCV: 77 fL — ABNORMAL LOW (ref 80–100)
Monocyte #: 0.5 x10 3/mm (ref 0.2–1.0)
Monocyte %: 12.7 %
Neutrophil #: 1.9 x10 3/mm (ref 1.4–6.5)
Platelet: 302 x10 3/mm (ref 150–440)
RBC: 4.8 10*6/uL (ref 4.40–5.90)

## 2011-07-18 LAB — MAGNESIUM: Magnesium: 1.8 mg/dL

## 2011-07-18 LAB — PROTIME-INR
INR: 1.4
Prothrombin Time: 17.9 secs — ABNORMAL HIGH (ref 11.5–14.7)

## 2011-07-18 LAB — CREATININE, SERUM
Creatinine: 0.96 mg/dL (ref 0.60–1.30)
EGFR (African American): >60
EGFR (Non-African Amer.): >60

## 2011-07-18 LAB — HEPATIC FUNCTION PANEL A (ARMC)
Bilirubin, Direct: 0.1 mg/dL (ref 0.00–0.20)
Bilirubin,Total: 0.3 mg/dL (ref 0.2–1.0)

## 2011-07-23 LAB — PROTIME-INR
INR: 1.9
Prothrombin Time: 21.7 secs — ABNORMAL HIGH (ref 11.5–14.7)

## 2011-07-26 LAB — CBC CANCER CENTER
Basophil %: 0.7 %
HCT: 37 % — ABNORMAL LOW (ref 40.0–52.0)
Lymphocyte #: 1.7 x10 3/mm (ref 1.0–3.6)
Lymphocyte %: 30.3 %
MCH: 25.5 pg — ABNORMAL LOW (ref 26.0–34.0)
MCHC: 33.1 g/dL (ref 32.0–36.0)
MCV: 77 fL — ABNORMAL LOW (ref 80–100)
Neutrophil #: 3.1 x10 3/mm (ref 1.4–6.5)
Platelet: 268 x10 3/mm (ref 150–440)
RDW: 17.8 % — ABNORMAL HIGH (ref 11.5–14.5)

## 2011-07-26 LAB — CREATININE, URINE, RANDOM: Creatinine, Urine Random: 100.6 mg/dL (ref 30.0–125.0)

## 2011-07-26 LAB — PROTIME-INR: INR: 1.6

## 2011-07-27 ENCOUNTER — Ambulatory Visit: Payer: Self-pay | Admitting: Internal Medicine

## 2011-07-27 LAB — CEA: CEA: 20.8 ng/mL — ABNORMAL HIGH (ref 0.0–4.7)

## 2011-08-01 LAB — PROTIME-INR: INR: 2

## 2011-08-01 LAB — URINALYSIS, COMPLETE
Bacteria: NONE SEEN
Glucose,UR: NEGATIVE mg/dL (ref 0–75)
Ketone: NEGATIVE
Nitrite: NEGATIVE
RBC,UR: 16 /HPF (ref 0–5)
WBC UR: 3 /HPF (ref 0–5)

## 2011-08-01 LAB — CBC CANCER CENTER
Basophil #: 0 x10 3/mm (ref 0.0–0.1)
Basophil %: 0.9 %
Eosinophil #: 0.2 x10 3/mm (ref 0.0–0.7)
Eosinophil %: 4.1 %
HCT: 36.3 % — ABNORMAL LOW (ref 40.0–52.0)
HGB: 12 g/dL — ABNORMAL LOW (ref 13.0–18.0)
Lymphocyte %: 30.4 %
MCHC: 33 g/dL (ref 32.0–36.0)
RBC: 4.7 10*6/uL (ref 4.40–5.90)
RDW: 18.3 % — ABNORMAL HIGH (ref 11.5–14.5)
WBC: 5 x10 3/mm (ref 3.8–10.6)

## 2011-08-01 LAB — HEPATIC FUNCTION PANEL A (ARMC)
Alkaline Phosphatase: 100 U/L (ref 50–136)
Bilirubin, Direct: 0.1 mg/dL (ref 0.00–0.20)
Bilirubin,Total: 0.3 mg/dL (ref 0.2–1.0)
SGOT(AST): 23 U/L (ref 15–37)
Total Protein: 7 g/dL (ref 6.4–8.2)

## 2011-08-01 LAB — POTASSIUM: Potassium: 4.3 mmol/L (ref 3.5–5.1)

## 2011-08-01 LAB — CREATININE, SERUM
Creatinine: 0.88 mg/dL (ref 0.60–1.30)
EGFR (African American): >60
EGFR (Non-African Amer.): >60

## 2011-08-02 LAB — URINE CULTURE

## 2011-08-08 LAB — CBC CANCER CENTER
Eosinophil #: 0.2 x10 3/mm (ref 0.0–0.7)
HCT: 36.8 % — ABNORMAL LOW (ref 40.0–52.0)
HGB: 12.1 g/dL — ABNORMAL LOW (ref 13.0–18.0)
MCHC: 32.7 g/dL (ref 32.0–36.0)
MCV: 78 fL — ABNORMAL LOW (ref 80–100)
Monocyte #: 0.4 x10 3/mm (ref 0.2–1.0)
Monocyte %: 12 %
Neutrophil %: 39.8 %
Platelet: 261 x10 3/mm (ref 150–440)
RBC: 4.72 10*6/uL (ref 4.40–5.90)
RDW: 17.1 % — ABNORMAL HIGH (ref 11.5–14.5)
WBC: 3.4 x10 3/mm — ABNORMAL LOW (ref 3.8–10.6)

## 2011-08-08 LAB — PROTIME-INR: INR: 1.8

## 2011-08-15 LAB — URINALYSIS, COMPLETE
Bilirubin,UR: NEGATIVE
Ketone: NEGATIVE
Nitrite: NEGATIVE
Ph: 5 (ref 4.5–8.0)
Protein: 30
Specific Gravity: 1.014 (ref 1.003–1.030)
Squamous Epithelial: NONE SEEN
WBC UR: 5 /HPF (ref 0–5)

## 2011-08-15 LAB — CBC CANCER CENTER
Basophil #: 0 x10 3/mm (ref 0.0–0.1)
Eosinophil #: 0.2 x10 3/mm (ref 0.0–0.7)
HCT: 36.6 % — ABNORMAL LOW (ref 40.0–52.0)
HGB: 12 g/dL — ABNORMAL LOW (ref 13.0–18.0)
Lymphocyte #: 1.5 x10 3/mm (ref 1.0–3.6)
MCV: 79 fL — ABNORMAL LOW (ref 80–100)
Monocyte #: 0.5 x10 3/mm (ref 0.2–1.0)
Neutrophil #: 2.5 x10 3/mm (ref 1.4–6.5)
Neutrophil %: 53 %
Platelet: 213 x10 3/mm (ref 150–440)
RDW: 18 % — ABNORMAL HIGH (ref 11.5–14.5)
WBC: 4.7 x10 3/mm (ref 3.8–10.6)

## 2011-08-15 LAB — COMPREHENSIVE METABOLIC PANEL
Alkaline Phosphatase: 91 U/L (ref 50–136)
Anion Gap: 7 (ref 7–16)
Bilirubin,Total: 0.3 mg/dL (ref 0.2–1.0)
Calcium, Total: 8.9 mg/dL (ref 8.5–10.1)
Chloride: 107 mmol/L (ref 98–107)
Co2: 27 mmol/L (ref 21–32)
Creatinine: 0.91 mg/dL (ref 0.60–1.30)
EGFR (African American): >60
Osmolality: 282 (ref 275–301)
Potassium: 4 mmol/L (ref 3.5–5.1)
SGOT(AST): 20 U/L (ref 15–37)
SGPT (ALT): 29 U/L
Sodium: 141 mmol/L (ref 136–145)

## 2011-08-16 LAB — CEA: CEA: 16.6 ng/mL — ABNORMAL HIGH (ref 0.0–4.7)

## 2011-08-19 LAB — CBC CANCER CENTER
Basophil #: 0 x10 3/mm (ref 0.0–0.1)
Eosinophil #: 0.3 x10 3/mm (ref 0.0–0.7)
HGB: 11.9 g/dL — ABNORMAL LOW (ref 13.0–18.0)
Lymphocyte #: 1.3 x10 3/mm (ref 1.0–3.6)
Lymphocyte %: 26.2 %
MCH: 25.6 pg — ABNORMAL LOW (ref 26.0–34.0)
MCV: 78 fL — ABNORMAL LOW (ref 80–100)
Monocyte #: 0.9 x10 3/mm (ref 0.2–1.0)
Neutrophil #: 2.4 x10 3/mm (ref 1.4–6.5)
Neutrophil %: 48.3 %
Platelet: 228 x10 3/mm (ref 150–440)
RDW: 17.9 % — ABNORMAL HIGH (ref 11.5–14.5)

## 2011-08-26 ENCOUNTER — Ambulatory Visit: Payer: Self-pay | Admitting: Internal Medicine

## 2011-08-26 LAB — PROTIME-INR
INR: 1.7
Prothrombin Time: 20.5 secs — ABNORMAL HIGH (ref 11.5–14.7)

## 2011-08-26 LAB — CBC CANCER CENTER
Basophil #: 0.1 x10 3/mm (ref 0.0–0.1)
Eosinophil #: 0.2 x10 3/mm (ref 0.0–0.7)
Eosinophil %: 3.8 %
HCT: 37.9 % — ABNORMAL LOW (ref 40.0–52.0)
HGB: 12.4 g/dL — ABNORMAL LOW (ref 13.0–18.0)
Lymphocyte #: 1.5 x10 3/mm (ref 1.0–3.6)
MCH: 25.5 pg — ABNORMAL LOW (ref 26.0–34.0)
MCHC: 32.6 g/dL (ref 32.0–36.0)
MCV: 78 fL — ABNORMAL LOW (ref 80–100)
Monocyte #: 0.5 x10 3/mm (ref 0.2–1.0)
Monocyte %: 9.6 %
Neutrophil %: 57.8 %
Platelet: 346 x10 3/mm (ref 150–440)
RDW: 17.3 % — ABNORMAL HIGH (ref 11.5–14.5)

## 2011-09-02 LAB — CBC CANCER CENTER
Basophil #: 0 x10 3/mm (ref 0.0–0.1)
Eosinophil #: 0.3 x10 3/mm (ref 0.0–0.7)
HCT: 37.3 % — ABNORMAL LOW (ref 40.0–52.0)
HGB: 12.7 g/dL — ABNORMAL LOW (ref 13.0–18.0)
Lymphocyte #: 1.5 x10 3/mm (ref 1.0–3.6)
Lymphocyte %: 28.5 %
MCV: 77 fL — ABNORMAL LOW (ref 80–100)
Monocyte %: 6.2 %
Neutrophil #: 3.1 x10 3/mm (ref 1.4–6.5)
Neutrophil %: 59.4 %
WBC: 5.2 x10 3/mm (ref 3.8–10.6)

## 2011-09-02 LAB — URINALYSIS, COMPLETE
Glucose,UR: NEGATIVE mg/dL (ref 0–75)
Nitrite: NEGATIVE
Ph: 5 (ref 4.5–8.0)
Protein: 30
WBC UR: 10 /HPF (ref 0–5)

## 2011-09-02 LAB — PROTEIN, URINE, RANDOM: Protein, Random Urine: 71 mg/dL — ABNORMAL HIGH (ref 0–12)

## 2011-09-02 LAB — PROTIME-INR: INR: 2

## 2011-09-03 LAB — CEA: CEA: 24.5 ng/mL — ABNORMAL HIGH (ref 0.0–4.7)

## 2011-09-09 LAB — CBC CANCER CENTER
Basophil #: 0.1 x10 3/mm (ref 0.0–0.1)
Eosinophil #: 0.3 x10 3/mm (ref 0.0–0.7)
HCT: 38.9 % — ABNORMAL LOW (ref 40.0–52.0)
Lymphocyte #: 1.5 x10 3/mm (ref 1.0–3.6)
Lymphocyte %: 20.4 %
MCH: 25.5 pg — ABNORMAL LOW (ref 26.0–34.0)
MCV: 78 fL — ABNORMAL LOW (ref 80–100)
Monocyte %: 6.7 %
Neutrophil #: 4.9 x10 3/mm (ref 1.4–6.5)
Neutrophil %: 67.9 %
Platelet: 254 x10 3/mm (ref 150–440)
RBC: 4.98 10*6/uL (ref 4.40–5.90)
RDW: 17.7 % — ABNORMAL HIGH (ref 11.5–14.5)
WBC: 7.2 x10 3/mm (ref 3.8–10.6)

## 2011-09-09 LAB — HEPATIC FUNCTION PANEL A (ARMC)
Albumin: 3.6 g/dL (ref 3.4–5.0)
Alkaline Phosphatase: 122 U/L (ref 50–136)
Bilirubin,Total: 0.3 mg/dL (ref 0.2–1.0)
Total Protein: 7.2 g/dL (ref 6.4–8.2)

## 2011-09-09 LAB — CREATININE, SERUM
Creatinine: 0.94 mg/dL (ref 0.60–1.30)
EGFR (Non-African Amer.): >60

## 2011-09-16 LAB — CBC CANCER CENTER
Eosinophil %: 3.4 %
HGB: 13.7 g/dL (ref 13.0–18.0)
Lymphocyte #: 1.9 x10 3/mm (ref 1.0–3.6)
MCH: 25.4 pg — ABNORMAL LOW (ref 26.0–34.0)
MCV: 78 fL — ABNORMAL LOW (ref 80–100)
Monocyte #: 0.7 x10 3/mm (ref 0.2–1.0)
Monocyte %: 16.9 %
Neutrophil #: 1.4 x10 3/mm (ref 1.4–6.5)
Neutrophil %: 33.2 %
Platelet: 330 x10 3/mm (ref 150–440)
RBC: 5.4 10*6/uL (ref 4.40–5.90)
RDW: 17.6 % — ABNORMAL HIGH (ref 11.5–14.5)
WBC: 4.2 x10 3/mm (ref 3.8–10.6)

## 2011-09-16 LAB — CREATININE, SERUM
Creatinine: 1.01 mg/dL (ref 0.60–1.30)
EGFR (African American): >60
EGFR (Non-African Amer.): >60

## 2011-09-21 ENCOUNTER — Emergency Department: Payer: Self-pay | Admitting: Emergency Medicine

## 2011-09-21 LAB — CBC WITH DIFFERENTIAL/PLATELET
Basophil #: 0 10*3/uL (ref 0.0–0.1)
Eosinophil #: 0 10*3/uL (ref 0.0–0.7)
Eosinophil %: 0 %
HCT: 39.6 % — ABNORMAL LOW (ref 40.0–52.0)
Lymphocyte #: 0.5 10*3/uL — ABNORMAL LOW (ref 1.0–3.6)
MCHC: 34.3 g/dL (ref 32.0–36.0)
MCV: 77 fL — ABNORMAL LOW (ref 80–100)
Monocyte #: 0.4 x10 3/mm (ref 0.2–1.0)
Monocyte %: 9.8 %
Neutrophil %: 75.8 %
Platelet: 215 10*3/uL (ref 150–440)
RBC: 5.14 10*6/uL (ref 4.40–5.90)
RDW: 17.4 % — ABNORMAL HIGH (ref 11.5–14.5)
WBC: 3.6 10*3/uL — ABNORMAL LOW (ref 3.8–10.6)

## 2011-09-21 LAB — URINALYSIS, COMPLETE
Ketone: NEGATIVE
Leukocyte Esterase: NEGATIVE
Nitrite: NEGATIVE
Ph: 5 (ref 4.5–8.0)
Protein: 30
Specific Gravity: 1.021 (ref 1.003–1.030)
WBC UR: 3 /HPF (ref 0–5)

## 2011-09-21 LAB — COMPREHENSIVE METABOLIC PANEL
BUN: 10 mg/dL (ref 7–18)
Chloride: 103 mmol/L (ref 98–107)
Co2: 27 mmol/L (ref 21–32)
EGFR (African American): >60
EGFR (Non-African Amer.): >60
SGOT(AST): 30 U/L (ref 15–37)
SGPT (ALT): 23 U/L
Total Protein: 7.1 g/dL (ref 6.4–8.2)

## 2011-09-24 ENCOUNTER — Ambulatory Visit: Payer: Self-pay | Admitting: Urology

## 2011-09-26 ENCOUNTER — Ambulatory Visit: Payer: Self-pay | Admitting: Internal Medicine

## 2011-09-27 LAB — CULTURE, BLOOD (SINGLE)

## 2011-10-04 LAB — CBC CANCER CENTER
Basophil %: 0.9 %
Eosinophil %: 3.1 %
HCT: 36.5 % — ABNORMAL LOW (ref 40.0–52.0)
HGB: 12.4 g/dL — ABNORMAL LOW (ref 13.0–18.0)
Lymphocyte #: 2 x10 3/mm (ref 1.0–3.6)
MCH: 26 pg (ref 26.0–34.0)
MCV: 77 fL — ABNORMAL LOW (ref 80–100)
Monocyte #: 0.8 x10 3/mm (ref 0.2–1.0)
Neutrophil #: 3.4 x10 3/mm (ref 1.4–6.5)
Neutrophil %: 52.5 %
Platelet: 316 x10 3/mm (ref 150–440)
RBC: 4.76 10*6/uL (ref 4.40–5.90)

## 2011-10-04 LAB — CREATININE, SERUM
Creatinine: 1.12 mg/dL (ref 0.60–1.30)
EGFR (African American): >60
EGFR (Non-African Amer.): >60

## 2011-10-08 LAB — HEPATIC FUNCTION PANEL A (ARMC)
Alkaline Phosphatase: 107 U/L (ref 50–136)
SGOT(AST): 23 U/L (ref 15–37)
SGPT (ALT): 24 U/L (ref 12–78)

## 2011-10-08 LAB — PROTIME-INR
INR: 3.5
Prothrombin Time: 35.1 secs — ABNORMAL HIGH (ref 11.5–14.7)

## 2011-10-09 LAB — CEA: CEA: 32.8 ng/mL — ABNORMAL HIGH (ref 0.0–4.7)

## 2011-10-16 LAB — PROTIME-INR
INR: 5
Prothrombin Time: 45.9 secs — ABNORMAL HIGH (ref 11.5–14.7)

## 2011-10-16 LAB — CBC CANCER CENTER
Basophil #: 0 x10 3/mm (ref 0.0–0.1)
Basophil %: 1 %
HCT: 32.4 % — ABNORMAL LOW (ref 40.0–52.0)
Lymphocyte #: 1 x10 3/mm (ref 1.0–3.6)
Lymphocyte %: 25.7 %
MCH: 25.3 pg — ABNORMAL LOW (ref 26.0–34.0)
MCV: 77 fL — ABNORMAL LOW (ref 80–100)
Monocyte %: 8.5 %
Neutrophil #: 2.4 x10 3/mm (ref 1.4–6.5)
RDW: 16.5 % — ABNORMAL HIGH (ref 11.5–14.5)
WBC: 4.1 x10 3/mm (ref 3.8–10.6)

## 2011-10-16 LAB — CREATININE, SERUM: Creatinine: 1.12 mg/dL (ref 0.60–1.30)

## 2011-10-18 LAB — PROTIME-INR
INR: 2.9
Prothrombin Time: 30.7 secs — ABNORMAL HIGH (ref 11.5–14.7)

## 2011-10-21 ENCOUNTER — Ambulatory Visit: Payer: Self-pay | Admitting: Urology

## 2011-10-21 LAB — PROTIME-INR
INR: 2.9
Prothrombin Time: 30.1 secs — ABNORMAL HIGH (ref 11.5–14.7)

## 2011-10-25 LAB — CBC CANCER CENTER
Basophil #: 0 x10 3/mm (ref 0.0–0.1)
Eosinophil #: 0.1 x10 3/mm (ref 0.0–0.7)
HCT: 29.7 % — ABNORMAL LOW (ref 40.0–52.0)
MCH: 25 pg — ABNORMAL LOW (ref 26.0–34.0)
MCV: 76 fL — ABNORMAL LOW (ref 80–100)
Monocyte %: 9.5 %
Neutrophil #: 2.5 x10 3/mm (ref 1.4–6.5)
Neutrophil %: 60.2 %
Platelet: 235 x10 3/mm (ref 150–440)
RDW: 16.3 % — ABNORMAL HIGH (ref 11.5–14.5)

## 2011-10-25 LAB — PROTIME-INR: INR: 3.7

## 2011-10-27 ENCOUNTER — Ambulatory Visit: Payer: Self-pay | Admitting: Internal Medicine

## 2011-10-29 LAB — CBC CANCER CENTER
Basophil %: 1.1 %
Eosinophil #: 0.2 x10 3/mm (ref 0.0–0.7)
Eosinophil %: 4.1 %
HGB: 9.7 g/dL — ABNORMAL LOW (ref 13.0–18.0)
Lymphocyte #: 1.3 x10 3/mm (ref 1.0–3.6)
Lymphocyte %: 30.8 %
MCV: 77 fL — ABNORMAL LOW (ref 80–100)
Monocyte %: 10 %
Neutrophil #: 2.3 x10 3/mm (ref 1.4–6.5)

## 2011-10-29 LAB — PROTIME-INR
INR: 2.2
Prothrombin Time: 24.4 secs — ABNORMAL HIGH (ref 11.5–14.7)

## 2011-10-31 LAB — HEPATIC FUNCTION PANEL A (ARMC)
Alkaline Phosphatase: 91 U/L (ref 50–136)
Bilirubin, Direct: 0.1 mg/dL (ref 0.00–0.20)
Bilirubin,Total: 0.4 mg/dL (ref 0.2–1.0)
SGOT(AST): 21 U/L (ref 15–37)

## 2011-10-31 LAB — CREATININE, SERUM
Creatinine: 0.98 mg/dL (ref 0.60–1.30)
EGFR (African American): >60
EGFR (Non-African Amer.): >60

## 2011-10-31 LAB — PROTIME-INR
INR: 1.9
Prothrombin Time: 22.4 secs — ABNORMAL HIGH (ref 11.5–14.7)

## 2011-11-07 LAB — CBC CANCER CENTER
Basophil #: 0 x10 3/mm (ref 0.0–0.1)
Basophil %: 0.7 %
Eosinophil #: 0.1 x10 3/mm (ref 0.0–0.7)
Eosinophil %: 3.9 %
HGB: 9.1 g/dL — ABNORMAL LOW (ref 13.0–18.0)
Lymphocyte #: 1.1 x10 3/mm (ref 1.0–3.6)
MCH: 24.8 pg — ABNORMAL LOW (ref 26.0–34.0)
MCV: 76 fL — ABNORMAL LOW (ref 80–100)
Monocyte %: 5.7 %
Neutrophil %: 58.9 %
RBC: 3.68 10*6/uL — ABNORMAL LOW (ref 4.40–5.90)
WBC: 3.7 x10 3/mm — ABNORMAL LOW (ref 3.8–10.6)

## 2011-11-07 LAB — PROTIME-INR
INR: 2.4
Prothrombin Time: 26.7 secs — ABNORMAL HIGH (ref 11.5–14.7)

## 2011-11-14 LAB — CBC CANCER CENTER
Basophil #: 0 x10 3/mm (ref 0.0–0.1)
Basophil %: 0.7 %
Eosinophil #: 0.1 x10 3/mm (ref 0.0–0.7)
Eosinophil %: 2 %
HGB: 10.2 g/dL — ABNORMAL LOW (ref 13.0–18.0)
Lymphocyte %: 28.6 %
MCHC: 32.2 g/dL (ref 32.0–36.0)
Neutrophil %: 56.3 %
RBC: 4.18 10*6/uL — ABNORMAL LOW (ref 4.40–5.90)
RDW: 16.1 % — ABNORMAL HIGH (ref 11.5–14.5)
WBC: 4.1 x10 3/mm (ref 3.8–10.6)

## 2011-11-14 LAB — PROTIME-INR
INR: 1.6
Prothrombin Time: 19.8 secs — ABNORMAL HIGH (ref 11.5–14.7)

## 2011-11-14 LAB — HEPATIC FUNCTION PANEL A (ARMC)
Albumin: 3.7 g/dL (ref 3.4–5.0)
Alkaline Phosphatase: 103 U/L (ref 50–136)
Bilirubin, Direct: 0.1 mg/dL (ref 0.00–0.20)
SGOT(AST): 26 U/L (ref 15–37)

## 2011-11-14 LAB — CREATININE, SERUM
EGFR (African American): >60
EGFR (Non-African Amer.): >60

## 2011-11-20 LAB — CBC CANCER CENTER
Basophil %: 1.4 %
Eosinophil #: 0.1 x10 3/mm (ref 0.0–0.7)
HGB: 8.9 g/dL — ABNORMAL LOW (ref 13.0–18.0)
Lymphocyte #: 1.2 x10 3/mm (ref 1.0–3.6)
MCH: 23.8 pg — ABNORMAL LOW (ref 26.0–34.0)
MCHC: 32.2 g/dL (ref 32.0–36.0)
Monocyte #: 0.4 x10 3/mm (ref 0.2–1.0)
Neutrophil %: 36.7 %
Platelet: 302 x10 3/mm (ref 150–440)
RBC: 3.76 10*6/uL — ABNORMAL LOW (ref 4.40–5.90)
RDW: 15.4 % — ABNORMAL HIGH (ref 11.5–14.5)

## 2011-11-25 LAB — CBC CANCER CENTER
Basophil #: 0 x10 3/mm (ref 0.0–0.1)
Eosinophil #: 0.2 x10 3/mm (ref 0.0–0.7)
Eosinophil %: 3.9 %
HCT: 28 % — ABNORMAL LOW (ref 40.0–52.0)
Lymphocyte #: 1.2 x10 3/mm (ref 1.0–3.6)
MCH: 23.8 pg — ABNORMAL LOW (ref 26.0–34.0)
MCHC: 32.4 g/dL (ref 32.0–36.0)
MCV: 74 fL — ABNORMAL LOW (ref 80–100)
Monocyte #: 0.5 x10 3/mm (ref 0.2–1.0)
Neutrophil %: 54.5 %
Platelet: 315 x10 3/mm (ref 150–440)
RBC: 3.8 10*6/uL — ABNORMAL LOW (ref 4.40–5.90)
RDW: 16.1 % — ABNORMAL HIGH (ref 11.5–14.5)

## 2011-11-25 LAB — POTASSIUM: Potassium: 3.1 mmol/L — ABNORMAL LOW (ref 3.5–5.1)

## 2011-11-25 LAB — PROTIME-INR: Prothrombin Time: 40.2 secs — ABNORMAL HIGH (ref 11.5–14.7)

## 2011-11-25 LAB — CREATININE, SERUM
EGFR (African American): >60
EGFR (Non-African Amer.): >60

## 2011-11-26 ENCOUNTER — Ambulatory Visit: Payer: Self-pay | Admitting: Internal Medicine

## 2011-11-27 LAB — PROTIME-INR: Prothrombin Time: 49.6 secs — ABNORMAL HIGH (ref 11.5–14.7)

## 2011-11-29 LAB — PROTIME-INR
INR: 1.7
Prothrombin Time: 20.2 secs — ABNORMAL HIGH (ref 11.5–14.7)

## 2011-12-02 LAB — CBC CANCER CENTER
Basophil #: 0 x10 3/mm (ref 0.0–0.1)
Basophil %: 0.8 %
Eosinophil #: 0.2 x10 3/mm (ref 0.0–0.7)
Eosinophil %: 5.1 %
HCT: 26.2 % — ABNORMAL LOW (ref 40.0–52.0)
HGB: 8.5 g/dL — ABNORMAL LOW (ref 13.0–18.0)
Lymphocyte #: 1.2 x10 3/mm (ref 1.0–3.6)
Lymphocyte %: 30.2 %
MCH: 24.1 pg — ABNORMAL LOW (ref 26.0–34.0)
MCHC: 32.5 g/dL (ref 32.0–36.0)
MCV: 74 fL — ABNORMAL LOW (ref 80–100)
Neutrophil #: 2.4 x10 3/mm (ref 1.4–6.5)
Platelet: 257 x10 3/mm (ref 150–440)
RBC: 3.54 10*6/uL — ABNORMAL LOW (ref 4.40–5.90)

## 2011-12-02 LAB — PROTIME-INR
INR: 1.2
Prothrombin Time: 15.1 secs — ABNORMAL HIGH (ref 11.5–14.7)

## 2011-12-03 LAB — CEA: CEA: 39.8 ng/mL — ABNORMAL HIGH (ref 0.0–4.7)

## 2011-12-09 LAB — CREATININE, SERUM
EGFR (African American): >60
EGFR (Non-African Amer.): >60

## 2011-12-09 LAB — CBC CANCER CENTER
Basophil #: 0 x10 3/mm (ref 0.0–0.1)
Eosinophil %: 3.7 %
HGB: 9 g/dL — ABNORMAL LOW (ref 13.0–18.0)
Lymphocyte #: 1.2 x10 3/mm (ref 1.0–3.6)
MCH: 23.5 pg — ABNORMAL LOW (ref 26.0–34.0)
MCHC: 31.9 g/dL — ABNORMAL LOW (ref 32.0–36.0)
MCV: 74 fL — ABNORMAL LOW (ref 80–100)
Monocyte #: 0.5 x10 3/mm (ref 0.2–1.0)
Neutrophil %: 61.4 %
Platelet: 247 x10 3/mm (ref 150–440)
RDW: 16.6 % — ABNORMAL HIGH (ref 11.5–14.5)

## 2011-12-09 LAB — POTASSIUM: Potassium: 3.5 mmol/L (ref 3.5–5.1)

## 2011-12-09 LAB — PROTIME-INR: INR: 1.6

## 2011-12-12 ENCOUNTER — Ambulatory Visit: Payer: Self-pay | Admitting: Internal Medicine

## 2011-12-13 LAB — HEPATIC FUNCTION PANEL A (ARMC)
Bilirubin, Direct: 0.1 mg/dL (ref 0.00–0.20)
SGOT(AST): 21 U/L (ref 15–37)

## 2011-12-13 LAB — PROTIME-INR
INR: 2.1
Prothrombin Time: 23.4 secs — ABNORMAL HIGH (ref 11.5–14.7)

## 2011-12-14 LAB — CEA: CEA: 42.5 ng/mL — ABNORMAL HIGH (ref 0.0–4.7)

## 2011-12-16 LAB — PROTIME-INR
INR: 2.2
Prothrombin Time: 24.5 secs — ABNORMAL HIGH (ref 11.5–14.7)

## 2011-12-23 LAB — CBC CANCER CENTER
Basophil #: 0 x10 3/mm (ref 0.0–0.1)
Eosinophil #: 0.2 x10 3/mm (ref 0.0–0.7)
HCT: 28.1 % — ABNORMAL LOW (ref 40.0–52.0)
HGB: 9 g/dL — ABNORMAL LOW (ref 13.0–18.0)
Lymphocyte %: 20.2 %
MCH: 22.7 pg — ABNORMAL LOW (ref 26.0–34.0)
Monocyte %: 9.4 %
Neutrophil #: 3.7 x10 3/mm (ref 1.4–6.5)
Neutrophil %: 66.6 %
Platelet: 336 x10 3/mm (ref 150–440)
RDW: 17.4 % — ABNORMAL HIGH (ref 11.5–14.5)
WBC: 5.5 x10 3/mm (ref 3.8–10.6)

## 2011-12-23 LAB — PROTIME-INR: Prothrombin Time: 36.5 secs — ABNORMAL HIGH (ref 11.5–14.7)

## 2011-12-23 LAB — POTASSIUM: Potassium: 3.7 mmol/L (ref 3.5–5.1)

## 2011-12-27 ENCOUNTER — Ambulatory Visit: Payer: Self-pay | Admitting: Internal Medicine

## 2011-12-30 LAB — FERRITIN: Ferritin (ARMC): 6 ng/mL — ABNORMAL LOW (ref 8–388)

## 2011-12-30 LAB — CBC CANCER CENTER
Basophil #: 0 x10 3/mm (ref 0.0–0.1)
Basophil %: 0.6 %
Eosinophil #: 0.6 x10 3/mm (ref 0.0–0.7)
Eosinophil %: 12.5 %
HCT: 26.4 % — ABNORMAL LOW (ref 40.0–52.0)
HGB: 8.4 g/dL — ABNORMAL LOW (ref 13.0–18.0)
Lymphocyte #: 0.8 x10 3/mm — ABNORMAL LOW (ref 1.0–3.6)
Lymphocyte %: 18 %
MCHC: 31.8 g/dL — ABNORMAL LOW (ref 32.0–36.0)
MCV: 70 fL — ABNORMAL LOW (ref 80–100)
Neutrophil #: 2.7 x10 3/mm (ref 1.4–6.5)
RDW: 16.8 % — ABNORMAL HIGH (ref 11.5–14.5)

## 2011-12-30 LAB — PROTIME-INR
INR: 3.7
Prothrombin Time: 36.7 secs — ABNORMAL HIGH (ref 11.5–14.7)

## 2012-01-01 LAB — PROTIME-INR
INR: 3.1
Prothrombin Time: 32.2 secs — ABNORMAL HIGH (ref 11.5–14.7)

## 2012-01-06 LAB — CANCER CENTER HEMOGLOBIN: HGB: 8.5 g/dL — ABNORMAL LOW (ref 13.0–18.0)

## 2012-01-06 LAB — PROTIME-INR: Prothrombin Time: 23.2 secs — ABNORMAL HIGH (ref 11.5–14.7)

## 2012-01-20 LAB — PROTIME-INR: INR: 2.6

## 2012-01-26 ENCOUNTER — Ambulatory Visit: Payer: Self-pay | Admitting: Internal Medicine

## 2012-01-27 ENCOUNTER — Inpatient Hospital Stay: Payer: Self-pay | Admitting: Internal Medicine

## 2012-01-27 LAB — CK TOTAL AND CKMB (NOT AT ARMC)
CK, Total: 39 U/L (ref 35–232)
CK, Total: 36 U/L (ref 35–232)
CK-MB: 0.6 ng/mL (ref 0.5–3.6)
CK-MB: <0.5 ng/mL — ABNORMAL LOW (ref 0.5–3.6)

## 2012-01-27 LAB — URINALYSIS, COMPLETE
Glucose,UR: NEGATIVE mg/dL (ref 0–75)
Ketone: NEGATIVE
Nitrite: NEGATIVE
Ph: 6 (ref 4.5–8.0)
Protein: 100
Specific Gravity: 1.009 (ref 1.003–1.030)

## 2012-01-27 LAB — CBC
HCT: 29.2 % — ABNORMAL LOW (ref 40.0–52.0)
HGB: 9.4 g/dL — ABNORMAL LOW (ref 13.0–18.0)
MCH: 23.9 pg — ABNORMAL LOW (ref 26.0–34.0)
MCHC: 32.1 g/dL (ref 32.0–36.0)
MCV: 74 fL — ABNORMAL LOW (ref 80–100)
Platelet: 176 10*3/uL (ref 150–440)
RBC: 3.92 10*6/uL — ABNORMAL LOW (ref 4.40–5.90)
WBC: 11.3 10*3/uL — ABNORMAL HIGH (ref 3.8–10.6)

## 2012-01-27 LAB — COMPREHENSIVE METABOLIC PANEL
Albumin: 2.3 g/dL — ABNORMAL LOW (ref 3.4–5.0)
Alkaline Phosphatase: 98 U/L (ref 50–136)
Bilirubin,Total: 0.4 mg/dL (ref 0.2–1.0)
Calcium, Total: 8.4 mg/dL — ABNORMAL LOW (ref 8.5–10.1)
Creatinine: 0.98 mg/dL (ref 0.60–1.30)
EGFR (Non-African Amer.): >60
Glucose: 175 mg/dL — ABNORMAL HIGH (ref 65–99)
Osmolality: 285 (ref 275–301)
Potassium: 3.9 mmol/L (ref 3.5–5.1)
Sodium: 139 mmol/L (ref 136–145)
Total Protein: 6.3 g/dL — ABNORMAL LOW (ref 6.4–8.2)

## 2012-01-27 LAB — PROTIME-INR
INR: 2
Prothrombin Time: 22.7 secs — ABNORMAL HIGH (ref 11.5–14.7)

## 2012-01-27 LAB — MAGNESIUM: Magnesium: 1.9 mg/dL

## 2012-01-27 LAB — APTT: Activated PTT: 40.9 secs — ABNORMAL HIGH (ref 23.6–35.9)

## 2012-01-27 LAB — T4, FREE: Free Thyroxine: 0.86 ng/dL (ref 0.76–1.46)

## 2012-01-27 LAB — TSH: Thyroid Stimulating Horm: 0.24 u[IU]/mL — ABNORMAL LOW

## 2012-01-28 LAB — BASIC METABOLIC PANEL
BUN: 22 mg/dL — ABNORMAL HIGH (ref 7–18)
Chloride: 110 mmol/L — ABNORMAL HIGH (ref 98–107)
Creatinine: 0.92 mg/dL (ref 0.60–1.30)
EGFR (Non-African Amer.): >60
Glucose: 134 mg/dL — ABNORMAL HIGH (ref 65–99)
Osmolality: 288 (ref 275–301)
Potassium: 4.4 mmol/L (ref 3.5–5.1)

## 2012-01-28 LAB — CBC WITH DIFFERENTIAL/PLATELET
Eosinophil #: 0 10*3/uL (ref 0.0–0.7)
MCH: 24 pg — ABNORMAL LOW (ref 26.0–34.0)
MCHC: 32.1 g/dL (ref 32.0–36.0)
MCV: 75 fL — ABNORMAL LOW (ref 80–100)
Monocyte #: 0.3 x10 3/mm (ref 0.2–1.0)
Neutrophil #: 6.1 10*3/uL (ref 1.4–6.5)
Neutrophil %: 92.6 %
Platelet: 150 10*3/uL (ref 150–440)
RBC: 3.83 10*6/uL — ABNORMAL LOW (ref 4.40–5.90)
RDW: 24.6 % — ABNORMAL HIGH (ref 11.5–14.5)
WBC: 6.6 10*3/uL (ref 3.8–10.6)

## 2012-01-28 LAB — TROPONIN I: Troponin-I: <0.02 ng/mL

## 2012-01-28 LAB — PROTIME-INR: INR: 2.4

## 2012-01-29 LAB — PROTIME-INR
INR: 3.6
Prothrombin Time: 35.6 secs — ABNORMAL HIGH (ref 11.5–14.7)

## 2012-01-29 LAB — URINALYSIS, COMPLETE
RBC,UR: 37542 /HPF (ref 0–5)
WBC UR: 62 /HPF (ref 0–5)

## 2012-01-30 LAB — PROTIME-INR: Prothrombin Time: 37.3 secs — ABNORMAL HIGH (ref 11.5–14.7)

## 2012-01-31 LAB — URINE CULTURE

## 2012-02-01 ENCOUNTER — Ambulatory Visit: Payer: Self-pay | Admitting: Internal Medicine

## 2012-02-01 LAB — PROTIME-INR: Prothrombin Time: 31.2 secs — ABNORMAL HIGH (ref 11.5–14.7)

## 2012-02-06 LAB — CBC CANCER CENTER
Basophil #: 0 x10 3/mm (ref 0.0–0.1)
Basophil %: 0.1 %
Eosinophil #: 0 x10 3/mm (ref 0.0–0.7)
Eosinophil %: 0 %
HGB: 11.3 g/dL — ABNORMAL LOW (ref 13.0–18.0)
Lymphocyte #: 0.5 x10 3/mm — ABNORMAL LOW (ref 1.0–3.6)
Lymphocyte %: 2.6 %
MCHC: 32.6 g/dL (ref 32.0–36.0)
MCV: 74 fL — ABNORMAL LOW (ref 80–100)
Monocyte #: 0.6 x10 3/mm (ref 0.2–1.0)
Neutrophil %: 94 %
Platelet: 324 x10 3/mm (ref 150–440)
RBC: 4.73 10*6/uL (ref 4.40–5.90)
RDW: 23.7 % — ABNORMAL HIGH (ref 11.5–14.5)
WBC: 19.4 x10 3/mm — ABNORMAL HIGH (ref 3.8–10.6)

## 2012-02-06 LAB — PROTIME-INR
INR: 2.4
Prothrombin Time: 26.4 secs — ABNORMAL HIGH (ref 11.5–14.7)

## 2012-02-06 LAB — HEPATIC FUNCTION PANEL A (ARMC)
Albumin: 2.6 g/dL — ABNORMAL LOW (ref 3.4–5.0)
Alkaline Phosphatase: 116 U/L (ref 50–136)
SGOT(AST): 24 U/L (ref 15–37)
SGPT (ALT): 61 U/L (ref 12–78)
Total Protein: 6.2 g/dL — ABNORMAL LOW (ref 6.4–8.2)

## 2012-02-06 LAB — CREATININE, SERUM: EGFR (Non-African Amer.): >60

## 2012-02-17 ENCOUNTER — Emergency Department: Payer: Self-pay | Admitting: Emergency Medicine

## 2012-02-17 LAB — URINALYSIS, COMPLETE
Bacteria: NONE SEEN
Bilirubin,UR: NEGATIVE
Glucose,UR: NEGATIVE mg/dL (ref 0–75)
Ketone: NEGATIVE
Ph: 6 (ref 4.5–8.0)
Protein: 30
RBC,UR: 175 /HPF (ref 0–5)
WBC UR: 30 /HPF (ref 0–5)

## 2012-02-17 LAB — COMPREHENSIVE METABOLIC PANEL
BUN: 13 mg/dL (ref 7–18)
Bilirubin,Total: 0.3 mg/dL (ref 0.2–1.0)
Chloride: 105 mmol/L (ref 98–107)
Co2: 24 mmol/L (ref 21–32)
Creatinine: 1.03 mg/dL (ref 0.60–1.30)
EGFR (African American): >60
EGFR (Non-African Amer.): >60
Osmolality: 277 (ref 275–301)
Potassium: 3.7 mmol/L (ref 3.5–5.1)
SGPT (ALT): 40 U/L (ref 12–78)
Sodium: 137 mmol/L (ref 136–145)
Total Protein: 5.8 g/dL — ABNORMAL LOW (ref 6.4–8.2)

## 2012-02-17 LAB — CBC WITH DIFFERENTIAL/PLATELET
Basophil #: 0 10*3/uL (ref 0.0–0.1)
Eosinophil #: 0.1 10*3/uL (ref 0.0–0.7)
HCT: 30.5 % — ABNORMAL LOW (ref 40.0–52.0)
Lymphocyte #: 0.5 10*3/uL — ABNORMAL LOW (ref 1.0–3.6)
Lymphocyte %: 6.6 %
MCHC: 32.9 g/dL (ref 32.0–36.0)
MCV: 73 fL — ABNORMAL LOW (ref 80–100)
Neutrophil #: 6.9 10*3/uL — ABNORMAL HIGH (ref 1.4–6.5)
Neutrophil %: 88 %
RDW: 24.5 % — ABNORMAL HIGH (ref 11.5–14.5)

## 2012-02-17 LAB — APTT: Activated PTT: 24.7 secs (ref 23.6–35.9)

## 2012-02-17 LAB — CK TOTAL AND CKMB (NOT AT ARMC)
CK, Total: 44 U/L (ref 35–232)
CK-MB: 2.1 ng/mL (ref 0.5–3.6)

## 2012-02-17 LAB — TROPONIN I: Troponin-I: 0.04 ng/mL

## 2012-02-17 LAB — TSH: Thyroid Stimulating Horm: 2.48 u[IU]/mL

## 2012-02-17 LAB — PROTIME-INR
INR: 1
Prothrombin Time: 13.9 secs (ref 11.5–14.7)

## 2012-02-17 LAB — MAGNESIUM: Magnesium: 1.5 mg/dL — ABNORMAL LOW

## 2012-02-18 LAB — URINE CULTURE

## 2012-02-20 LAB — PROTIME-INR: INR: 1.1

## 2012-02-24 ENCOUNTER — Inpatient Hospital Stay: Payer: Self-pay | Admitting: Internal Medicine

## 2012-02-24 LAB — COMPREHENSIVE METABOLIC PANEL
Albumin: 1.8 g/dL — ABNORMAL LOW (ref 3.4–5.0)
Alkaline Phosphatase: 114 U/L (ref 50–136)
Anion Gap: 9 (ref 7–16)
Bilirubin,Total: 0.4 mg/dL (ref 0.2–1.0)
Chloride: 104 mmol/L (ref 98–107)
Co2: 24 mmol/L (ref 21–32)
Creatinine: 1.07 mg/dL (ref 0.60–1.30)
EGFR (African American): >60
EGFR (Non-African Amer.): >60
Glucose: 109 mg/dL — ABNORMAL HIGH (ref 65–99)
Potassium: 3.8 mmol/L (ref 3.5–5.1)
SGPT (ALT): 20 U/L (ref 12–78)
Sodium: 137 mmol/L (ref 136–145)
Total Protein: 5.9 g/dL — ABNORMAL LOW (ref 6.4–8.2)

## 2012-02-24 LAB — URINALYSIS, COMPLETE
Bacteria: NONE SEEN
Bilirubin,UR: NEGATIVE
Glucose,UR: NEGATIVE mg/dL (ref 0–75)
Nitrite: NEGATIVE
Ph: 5 (ref 4.5–8.0)
Protein: 30
RBC,UR: 18 /HPF (ref 0–5)
WBC UR: 21 /HPF (ref 0–5)

## 2012-02-24 LAB — CBC WITH DIFFERENTIAL/PLATELET
Basophil %: 1 %
Eosinophil #: 0.1 10*3/uL (ref 0.0–0.7)
Eosinophil %: 1.8 %
HCT: 27.8 % — ABNORMAL LOW (ref 40.0–52.0)
HGB: 8.6 g/dL — ABNORMAL LOW (ref 13.0–18.0)
Lymphocyte %: 10.2 %
MCHC: 31 g/dL — ABNORMAL LOW (ref 32.0–36.0)
Monocyte %: 5.6 %
Neutrophil #: 3.7 10*3/uL (ref 1.4–6.5)
Neutrophil %: 81.4 %
Platelet: 192 10*3/uL (ref 150–440)
RBC: 3.82 10*6/uL — ABNORMAL LOW (ref 4.40–5.90)
WBC: 4.6 10*3/uL (ref 3.8–10.6)

## 2012-02-24 LAB — PROTIME-INR: INR: 1.1

## 2012-02-24 LAB — RAPID INFLUENZA A&B ANTIGENS

## 2012-02-24 LAB — TROPONIN I: Troponin-I: <0.02 ng/mL

## 2012-02-24 LAB — PRO B NATRIURETIC PEPTIDE: B-Type Natriuretic Peptide: 2005 pg/mL — ABNORMAL HIGH (ref 0–125)

## 2012-02-25 LAB — CBC WITH DIFFERENTIAL/PLATELET
Basophil #: 0 10*3/uL (ref 0.0–0.1)
Eosinophil #: 0.1 10*3/uL (ref 0.0–0.7)
Lymphocyte #: 0.5 10*3/uL — ABNORMAL LOW (ref 1.0–3.6)
Lymphocyte %: 12.5 %
MCH: 23.6 pg — ABNORMAL LOW (ref 26.0–34.0)
MCV: 71 fL — ABNORMAL LOW (ref 80–100)
Monocyte #: 0.2 x10 3/mm (ref 0.2–1.0)
Platelet: 211 10*3/uL (ref 150–440)
RDW: 24.3 % — ABNORMAL HIGH (ref 11.5–14.5)
WBC: 3.9 10*3/uL (ref 3.8–10.6)

## 2012-02-25 LAB — BASIC METABOLIC PANEL
Anion Gap: 8 (ref 7–16)
BUN: 10 mg/dL (ref 7–18)
Chloride: 105 mmol/L (ref 98–107)
Creatinine: 1.06 mg/dL (ref 0.60–1.30)
EGFR (African American): >60
EGFR (Non-African Amer.): >60
Glucose: 101 mg/dL — ABNORMAL HIGH (ref 65–99)
Potassium: 4.5 mmol/L (ref 3.5–5.1)

## 2012-02-26 ENCOUNTER — Ambulatory Visit: Payer: Self-pay | Admitting: Internal Medicine

## 2012-02-26 LAB — CBC WITH DIFFERENTIAL/PLATELET
Basophil #: 0 10*3/uL (ref 0.0–0.1)
Basophil %: 0.5 %
Eosinophil #: 0.1 10*3/uL (ref 0.0–0.7)
Eosinophil %: 1.5 %
HCT: 25.5 % — ABNORMAL LOW (ref 40.0–52.0)
HGB: 8.8 g/dL — ABNORMAL LOW (ref 13.0–18.0)
Lymphocyte #: 0.4 10*3/uL — ABNORMAL LOW (ref 1.0–3.6)
Lymphocyte %: 10.1 %
MCH: 24.8 pg — ABNORMAL LOW (ref 26.0–34.0)
MCHC: 34.5 g/dL (ref 32.0–36.0)
Monocyte %: 4.5 %
Neutrophil #: 3.7 10*3/uL (ref 1.4–6.5)
Neutrophil %: 83.4 %
Platelet: 228 10*3/uL (ref 150–440)
RBC: 3.55 10*6/uL — ABNORMAL LOW (ref 4.40–5.90)
WBC: 4.4 10*3/uL (ref 3.8–10.6)

## 2012-02-26 LAB — PROTIME-INR: Prothrombin Time: 16 secs — ABNORMAL HIGH (ref 11.5–14.7)

## 2012-02-27 LAB — CBC WITH DIFFERENTIAL/PLATELET
Basophil %: 0.6 %
Eosinophil %: 1.2 %
HCT: 27.1 % — ABNORMAL LOW (ref 40.0–52.0)
HGB: 9 g/dL — ABNORMAL LOW (ref 13.0–18.0)
Lymphocyte #: 0.6 10*3/uL — ABNORMAL LOW (ref 1.0–3.6)
Lymphocyte %: 12.1 %
MCH: 24.1 pg — ABNORMAL LOW (ref 26.0–34.0)
MCHC: 33.2 g/dL (ref 32.0–36.0)
MCV: 73 fL — ABNORMAL LOW (ref 80–100)
Monocyte #: 0.3 x10 3/mm (ref 0.2–1.0)
Neutrophil #: 4 10*3/uL (ref 1.4–6.5)
Neutrophil %: 80.2 %
RBC: 3.73 10*6/uL — ABNORMAL LOW (ref 4.40–5.90)
WBC: 5 10*3/uL (ref 3.8–10.6)

## 2012-02-27 LAB — PROTIME-INR
INR: 1.2
Prothrombin Time: 16 secs — ABNORMAL HIGH (ref 11.5–14.7)

## 2012-02-27 LAB — BASIC METABOLIC PANEL
Anion Gap: 7 (ref 7–16)
Calcium, Total: 7.8 mg/dL — ABNORMAL LOW (ref 8.5–10.1)
Chloride: 104 mmol/L (ref 98–107)
Co2: 28 mmol/L (ref 21–32)
EGFR (Non-African Amer.): >60
Glucose: 83 mg/dL (ref 65–99)
Sodium: 139 mmol/L (ref 136–145)

## 2012-02-28 LAB — CBC WITH DIFFERENTIAL/PLATELET
Basophil #: 0 10*3/uL (ref 0.0–0.1)
Eosinophil #: 0.1 10*3/uL (ref 0.0–0.7)
Eosinophil %: 1.2 %
Lymphocyte %: 11.9 %
MCH: 24.6 pg — ABNORMAL LOW (ref 26.0–34.0)
MCV: 72 fL — ABNORMAL LOW (ref 80–100)
Neutrophil #: 3.5 10*3/uL (ref 1.4–6.5)
Neutrophil %: 77.6 %
Platelet: 324 10*3/uL (ref 150–440)
RBC: 3.84 10*6/uL — ABNORMAL LOW (ref 4.40–5.90)
RDW: 23.7 % — ABNORMAL HIGH (ref 11.5–14.5)
WBC: 4.5 10*3/uL (ref 3.8–10.6)

## 2012-02-28 LAB — BASIC METABOLIC PANEL
Anion Gap: 7 (ref 7–16)
BUN: 11 mg/dL (ref 7–18)
Calcium, Total: 8.3 mg/dL — ABNORMAL LOW (ref 8.5–10.1)
Co2: 28 mmol/L (ref 21–32)
Creatinine: 0.98 mg/dL (ref 0.60–1.30)
EGFR (African American): >60
EGFR (Non-African Amer.): >60
Osmolality: 273 (ref 275–301)
Potassium: 4.4 mmol/L (ref 3.5–5.1)

## 2012-02-28 LAB — PROTIME-INR: INR: 1.4

## 2012-02-29 LAB — CBC WITH DIFFERENTIAL/PLATELET
Basophil #: 0 10*3/uL (ref 0.0–0.1)
Basophil %: 0.8 %
Eosinophil #: 0.1 10*3/uL (ref 0.0–0.7)
Eosinophil %: 1.6 %
HCT: 29.2 % — ABNORMAL LOW (ref 40.0–52.0)
HGB: 9.9 g/dL — ABNORMAL LOW (ref 13.0–18.0)
Lymphocyte %: 13.6 %
MCH: 24.3 pg — ABNORMAL LOW (ref 26.0–34.0)
MCHC: 34.1 g/dL (ref 32.0–36.0)
MCV: 71 fL — ABNORMAL LOW (ref 80–100)
Monocyte #: 0.5 x10 3/mm (ref 0.2–1.0)
Monocyte %: 12 %
Neutrophil #: 3.2 10*3/uL (ref 1.4–6.5)
Neutrophil %: 72 %
RBC: 4.09 10*6/uL — ABNORMAL LOW (ref 4.40–5.90)
RDW: 24.4 % — ABNORMAL HIGH (ref 11.5–14.5)

## 2012-02-29 LAB — BASIC METABOLIC PANEL
BUN: 13 mg/dL (ref 7–18)
Calcium, Total: 8.5 mg/dL (ref 8.5–10.1)
Chloride: 99 mmol/L (ref 98–107)
Co2: 30 mmol/L (ref 21–32)
EGFR (African American): >60
EGFR (Non-African Amer.): >60
Glucose: 96 mg/dL (ref 65–99)
Sodium: 135 mmol/L — ABNORMAL LOW (ref 136–145)

## 2012-02-29 LAB — PROTIME-INR
INR: 1.4
Prothrombin Time: 17.9 secs — ABNORMAL HIGH (ref 11.5–14.7)

## 2012-03-01 LAB — CBC WITH DIFFERENTIAL/PLATELET
Basophil #: 0 10*3/uL (ref 0.0–0.1)
Basophil %: 0.7 %
Eosinophil %: 1.2 %
HCT: 30.5 % — ABNORMAL LOW (ref 40.0–52.0)
HGB: 9.8 g/dL — ABNORMAL LOW (ref 13.0–18.0)
Lymphocyte #: 0.7 10*3/uL — ABNORMAL LOW (ref 1.0–3.6)
Lymphocyte %: 13 %
MCH: 23 pg — ABNORMAL LOW (ref 26.0–34.0)
MCHC: 32.2 g/dL (ref 32.0–36.0)
Monocyte #: 0.7 x10 3/mm (ref 0.2–1.0)
Neutrophil #: 4.2 10*3/uL (ref 1.4–6.5)
Neutrophil %: 73.1 %
Platelet: 457 10*3/uL — ABNORMAL HIGH (ref 150–440)

## 2012-03-01 LAB — PROTIME-INR: INR: 1.8

## 2012-03-02 LAB — BASIC METABOLIC PANEL
Anion Gap: 7 (ref 7–16)
Chloride: 99 mmol/L (ref 98–107)
Co2: 31 mmol/L (ref 21–32)
Creatinine: 0.96 mg/dL (ref 0.60–1.30)
EGFR (African American): >60
EGFR (Non-African Amer.): >60
Osmolality: 273 (ref 275–301)
Potassium: 4.2 mmol/L (ref 3.5–5.1)
Sodium: 137 mmol/L (ref 136–145)

## 2012-03-02 LAB — CBC WITH DIFFERENTIAL/PLATELET
HGB: 9.9 g/dL — ABNORMAL LOW (ref 13.0–18.0)
Lymphocytes: 17 %
MCH: 24 pg — ABNORMAL LOW (ref 26.0–34.0)
MCHC: 33.7 g/dL (ref 32.0–36.0)
MCV: 71 fL — ABNORMAL LOW (ref 80–100)
Monocytes: 8 %
NRBC/100 WBC: 1 /
RDW: 23.9 % — ABNORMAL HIGH (ref 11.5–14.5)
Segmented Neutrophils: 72 %
Variant Lymphocyte - H1-Rlymph: 2 %

## 2012-03-02 LAB — PROTIME-INR: Prothrombin Time: 25.2 secs — ABNORMAL HIGH (ref 11.5–14.7)

## 2012-03-03 LAB — PROTIME-INR: INR: 2.1

## 2012-03-05 LAB — CULTURE, BLOOD (SINGLE)

## 2012-03-28 ENCOUNTER — Ambulatory Visit: Payer: Self-pay | Admitting: Internal Medicine

## 2012-06-25 DEATH — deceased

## 2013-01-19 IMAGING — NM NUCLEAR MEDICINE WHOLE BODY BONE SCINTIGRAPHY
2 series · 8 of 8 positions shown · non-contrast
Comparison: none

REASON FOR EXAM: metastatic colon CA back pain
COMMENTS:

PROCEDURE:     KNM - KNM BONE WB 3HR [DATE]  [DATE]
RESULT:
PROCEDURE:  Total Body Bone Scan is performed status post intravenous
administration of 22.66 mCi of technetium labeled MDP.

[Series 1000: statics · 2.40mm/px · 3 acquisitions, 6 frames shown]
[im 1/3]
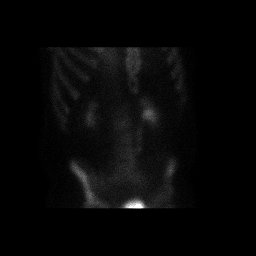
[im 1/3]
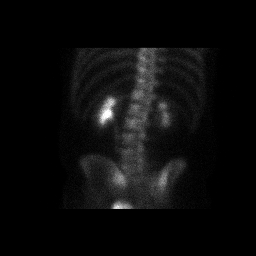
[im 2/3]
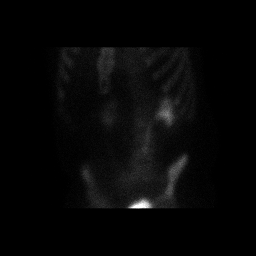
[im 2/3]
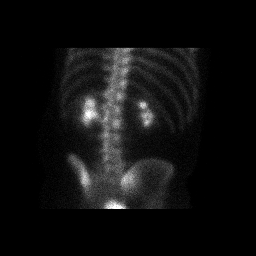
[im 3/3]
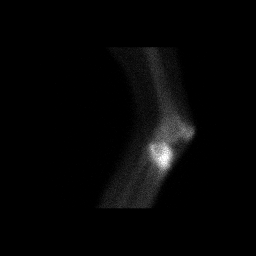
[im 3/3]
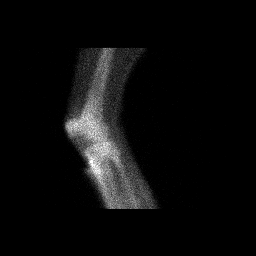

[Series 1000: 3 hr wholebody · 2.40mm/px · 2 of 2 frames shown]
[frame 1/2]
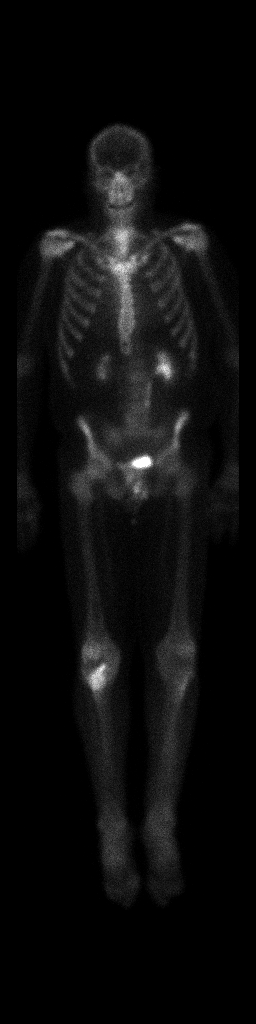
[frame 2/2]
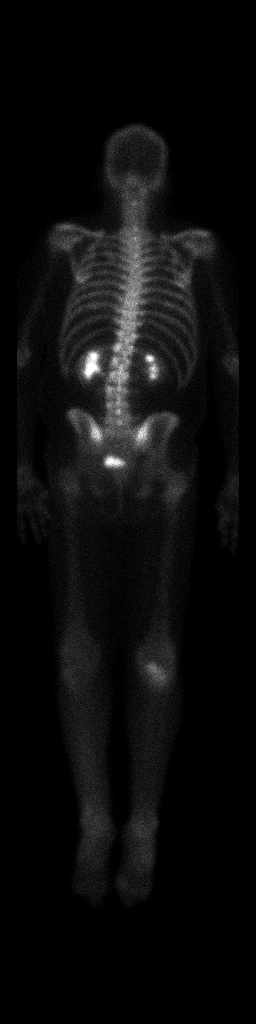

[8 of 8 positions shown; findings below may reference images not displayed]

FINDINGS: Appropriate bio-distribution is identified in the region of the
right and left kidneys and urinary bladder.

An area of asymmetric osseous remodeling projects just distal to the lateral
tibial plateau region on the right. Differential considerations are possibly
findings secondary to degenerative change though the position appears to be
slightly distal to the joint. Post traumatic finding and/or post surgical
cannot be excluded considering the location. Metastatic disease considering
the orientation and positioning cannot be completely excluded though is of
lower differential consideration. Correlation with plain film evaluation is
recommended. Small punctate areas of radiotracer activity project in the
region of the pubic symphysis and extending to the left. Differential
considerations are areas of contamination though findings secondary to
degenerative change cannot be excluded. Again, metastatic disease cannot be
excluded and correlation with plain film of the pelvis is recommended.

No further regions of asymmetric foci of osseous remodeling within the
appendicular and axial skeleton are identified.
IMPRESSION: 1.  Area of asymmetric osseous remodeling deep to the lateral tibial plateau
region. Differential considerations are prior trauma or possibly
degenerative change. Metastatic disease is also of diagnostic consideration
considering the orientation and positioning. Correlation with plain film
evaluation is recommended.
2.  Ill-defined areas of osseous remodeling involving the pubic symphysis
region on the left as described above and again correlation with plain film
evaluation is recommended. Contamination is of diagnostic consideration as
well as possibly degenerative changes though metastatic disease again cannot
be excluded.

## 2013-05-18 IMAGING — CR DG CHEST 2V
1 series · 3 of 3 positions shown · non-contrast
Comparison: none

REASON FOR EXAM: shortness of breath
COMMENTS:

PROCEDURE:     DXR - DXR CHEST PA (OR AP) AND LATERAL  - February 17, 2012 [DATE]
RESULT:     Comparison: 01/27/2012 and CT of the chest 01/27/2012

[Series 1: x chest ap · 0.14mm/px · 3 of 3 slices shown]
[im 1/3]
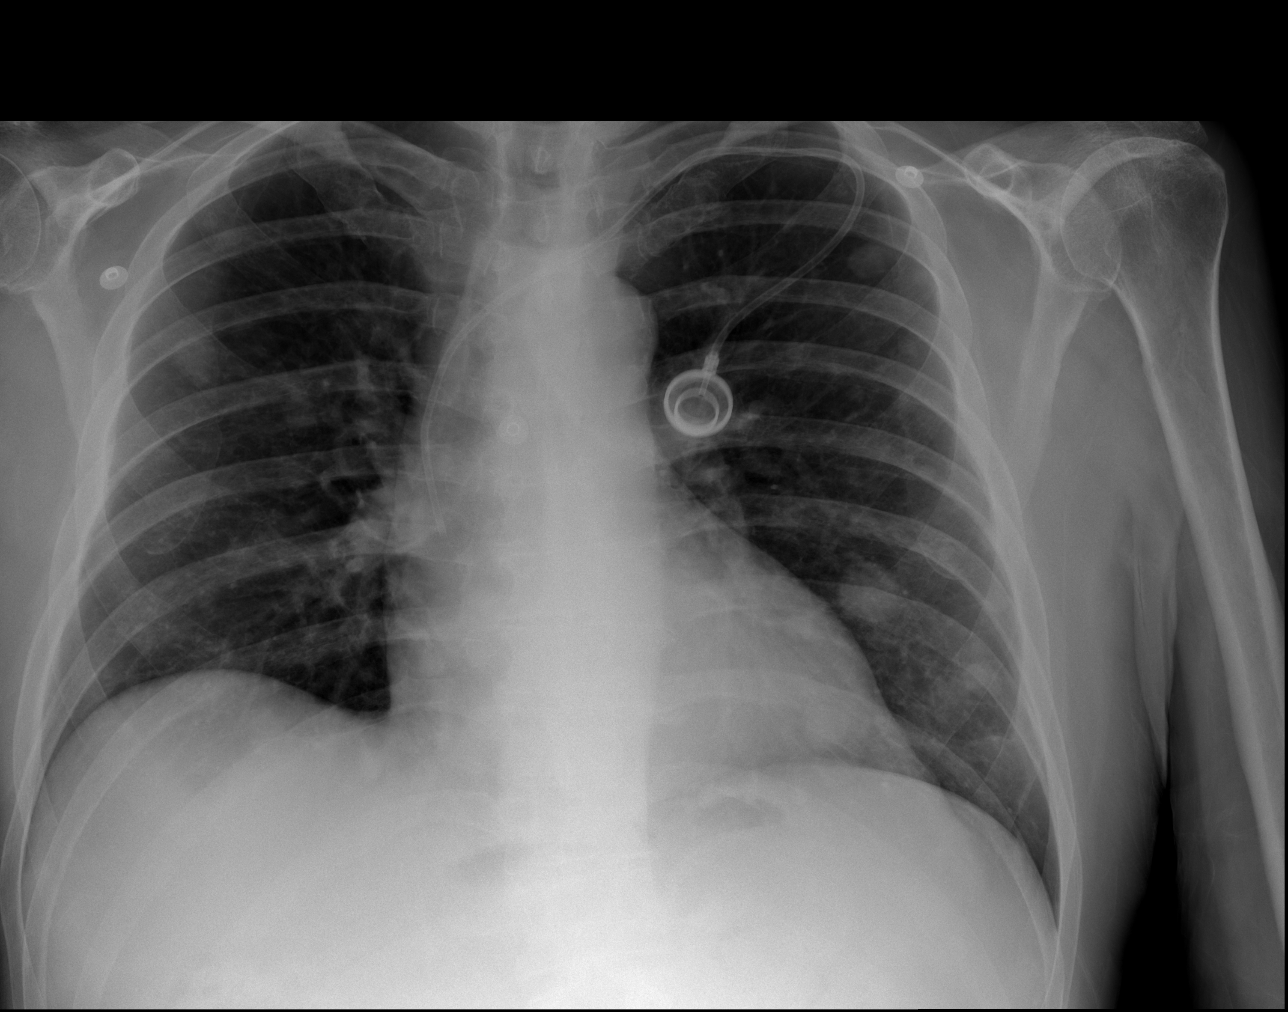
[im 2/3]
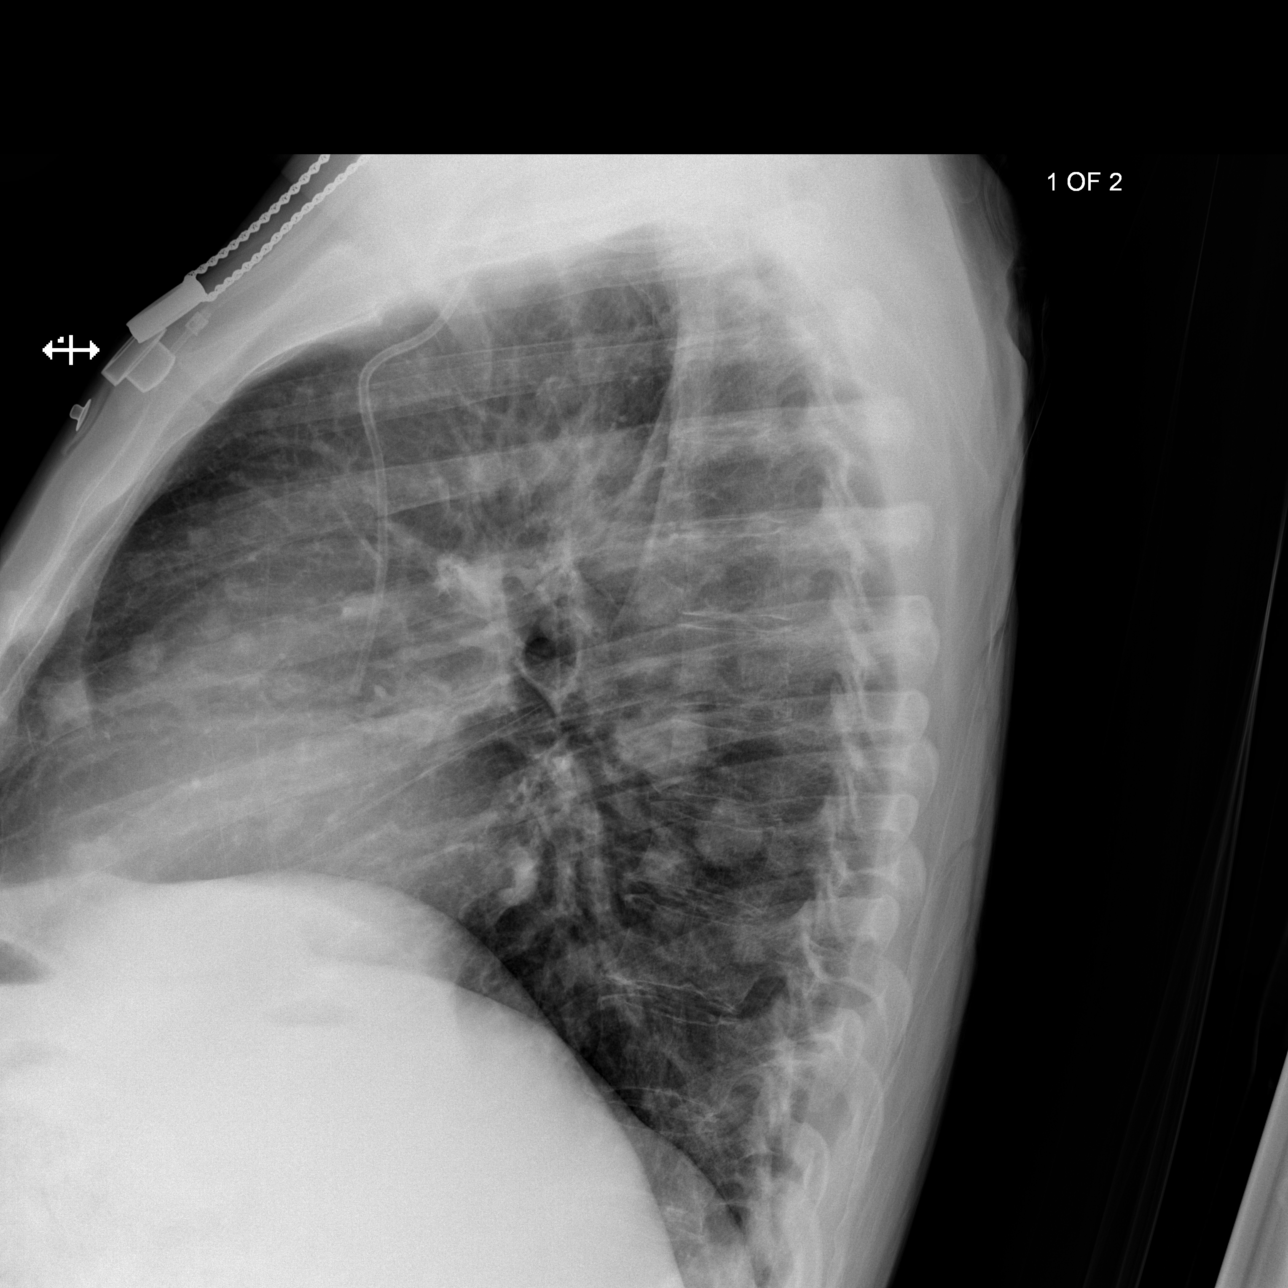
[im 3/3]
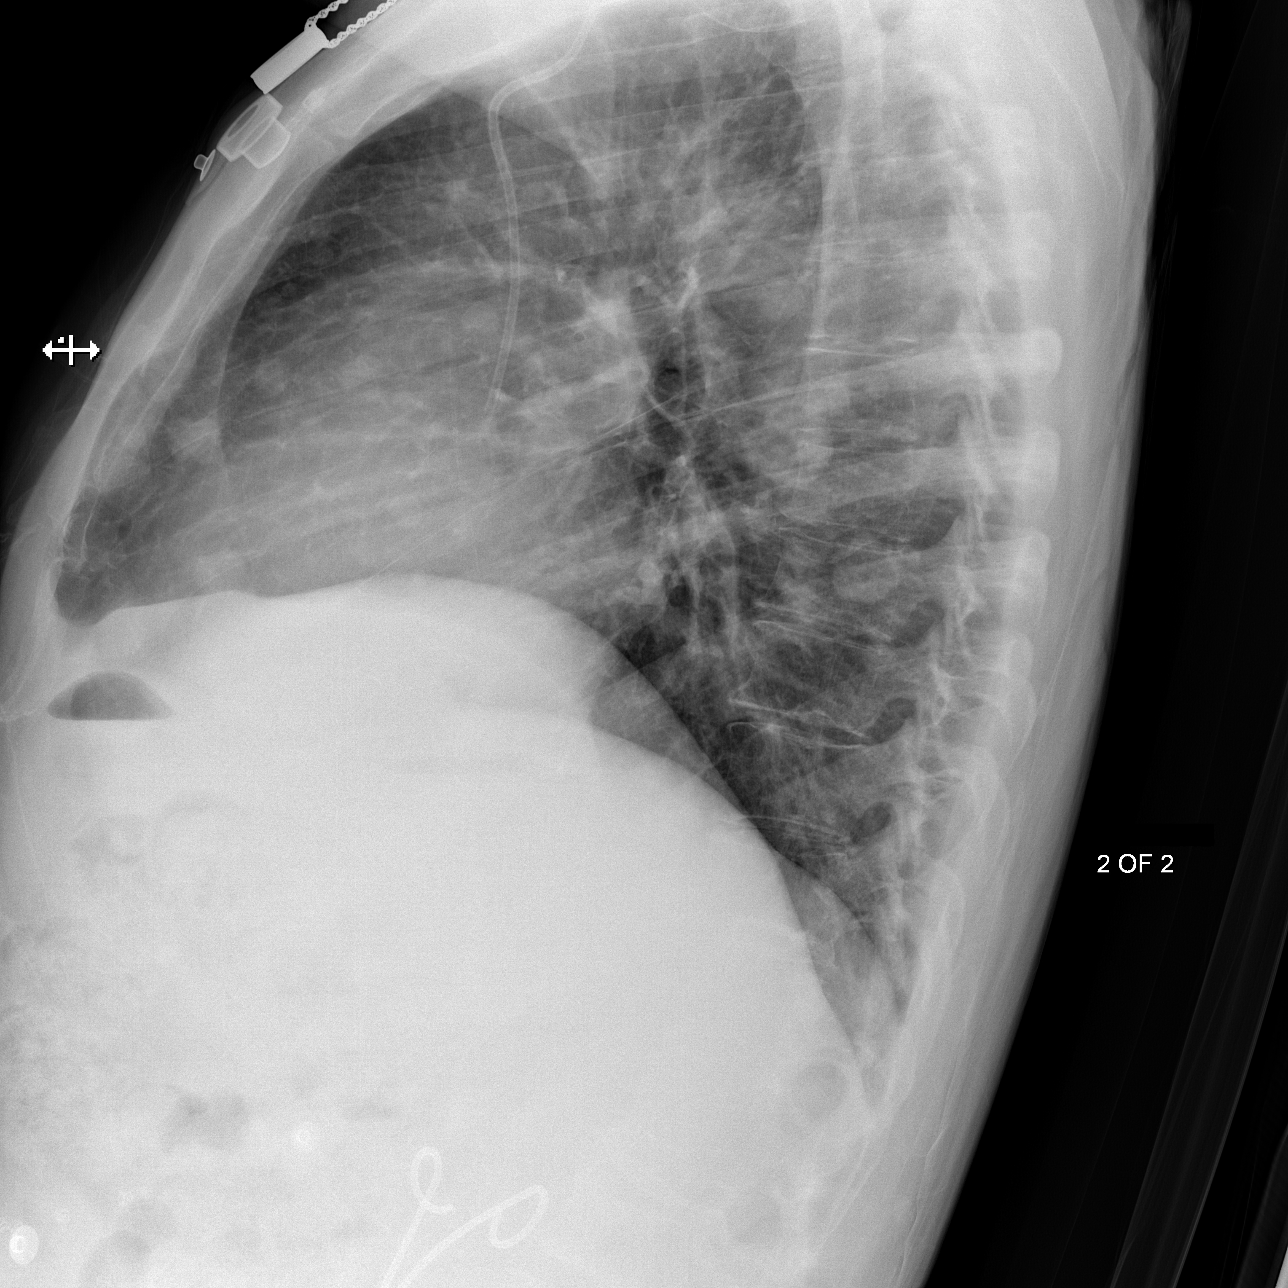

[3 of 3 positions shown; findings below may reference images not displayed]

FINDINGS: Left subclavian portacatheter tip terminates over the cavoatrial junction.
The heart and mediastinum are stable. Bilateral pulmonary nodule are similar
to prior. No definite new focal pulmonary opacities.
IMPRESSION: No acute cardiopulmonary disease. Bilateral pulmonary nodules are similar to
recent prior.

[REDACTED]

## 2013-05-28 IMAGING — CR DG CHEST 2V
1 series · 3 of 3 positions shown · non-contrast
Comparison: none

REASON FOR EXAM: SOB
COMMENTS:

[Series 4: x chest ap · 0.14mm/px · 3 of 3 slices shown]
[im 1/3]
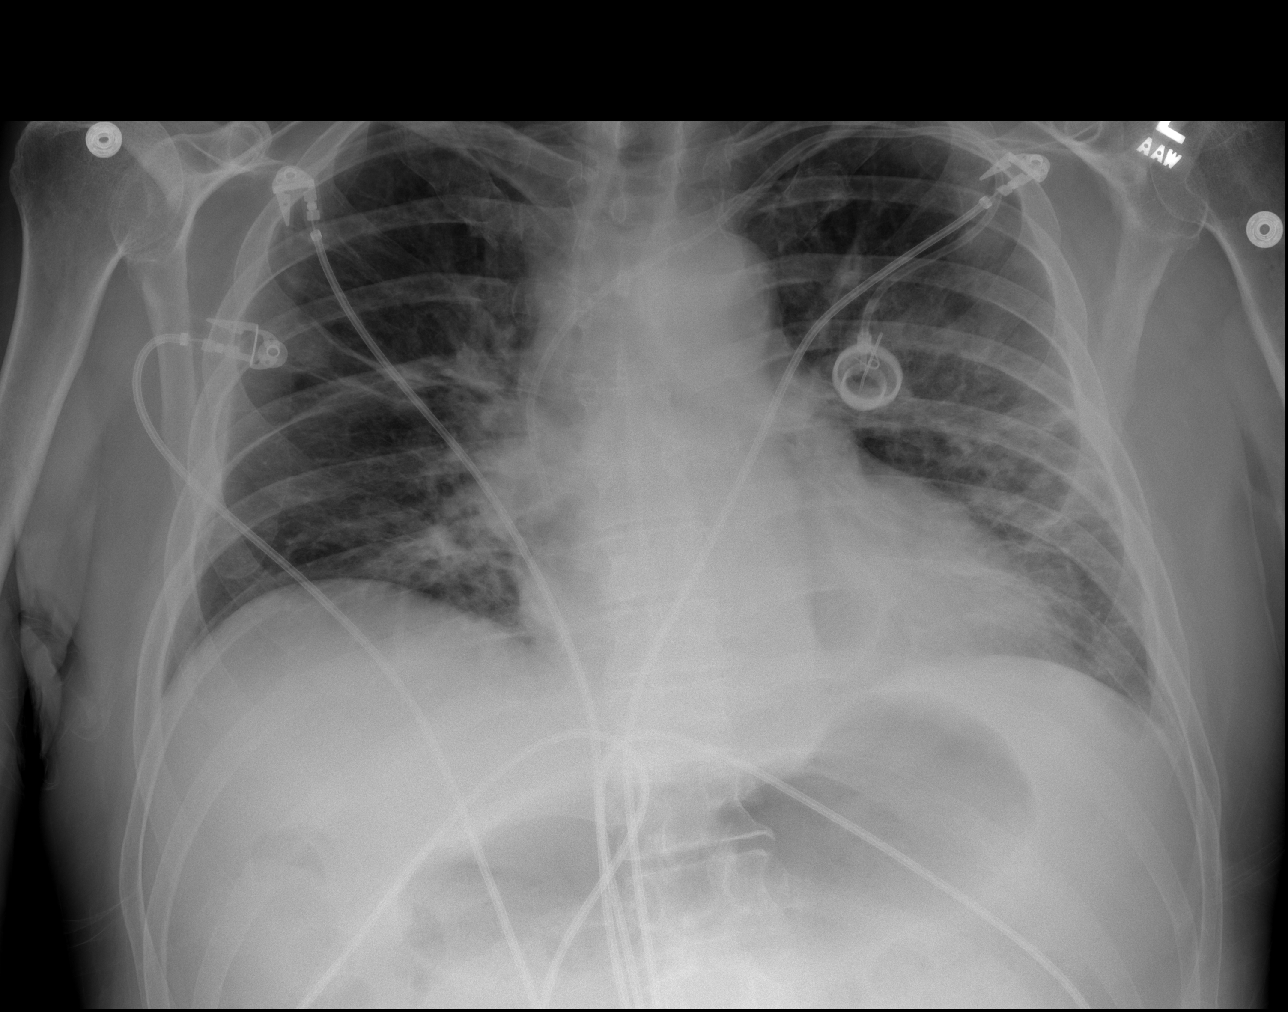
[im 2/3]
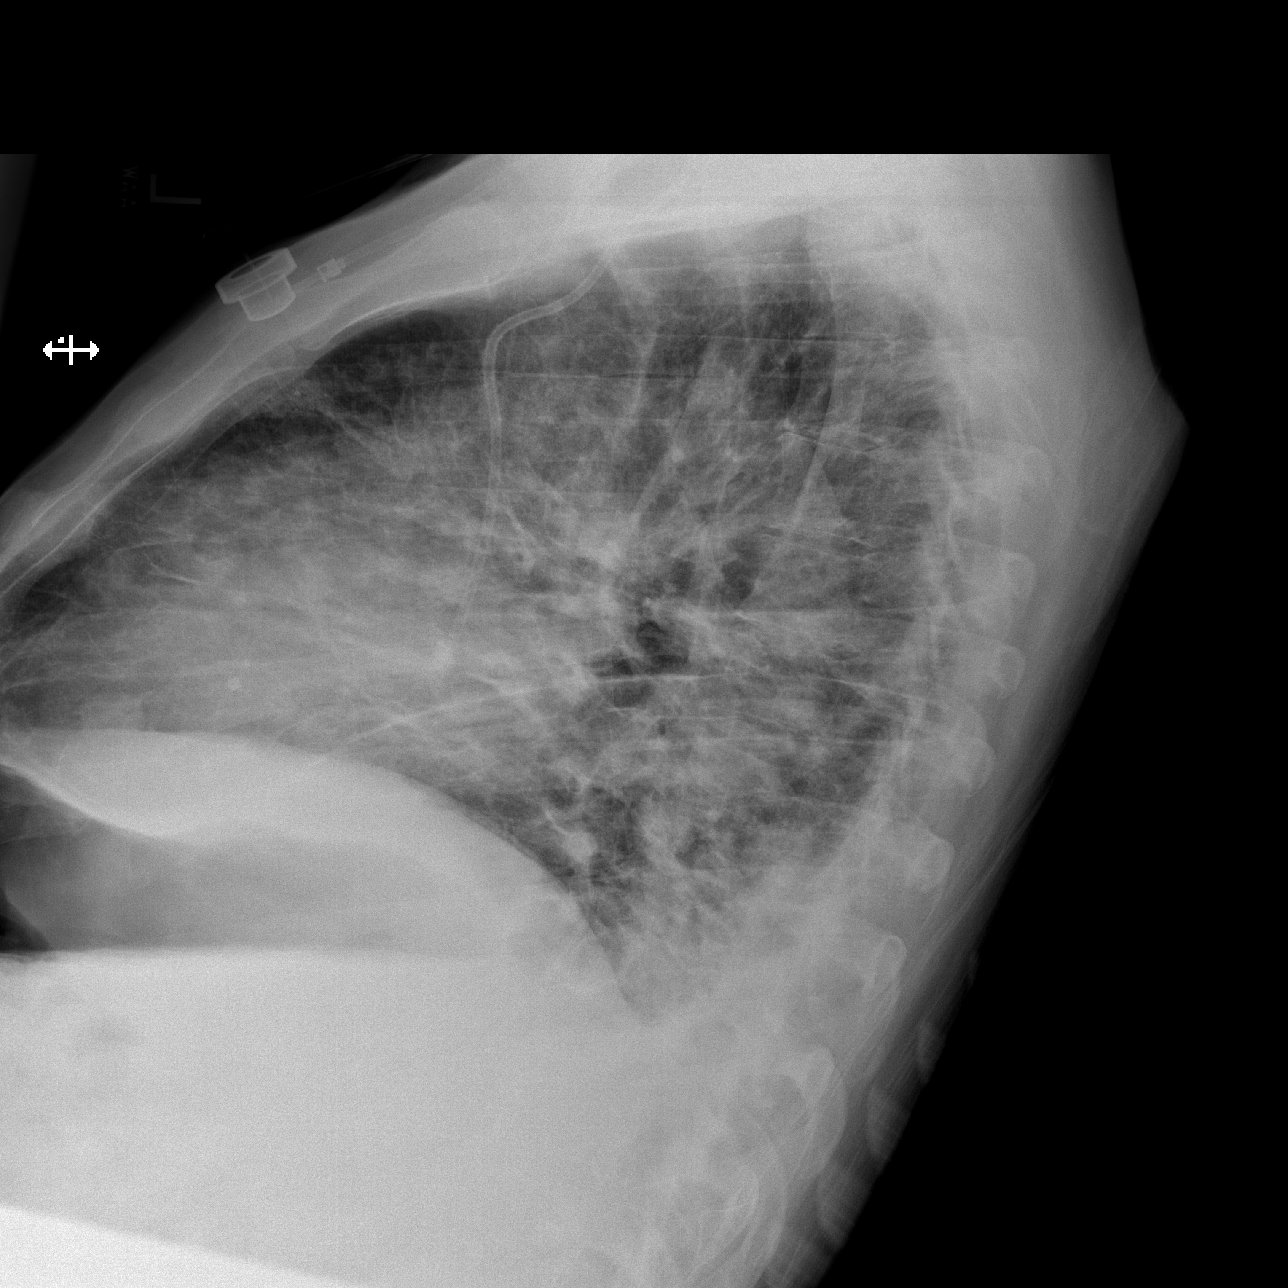
[im 3/3]
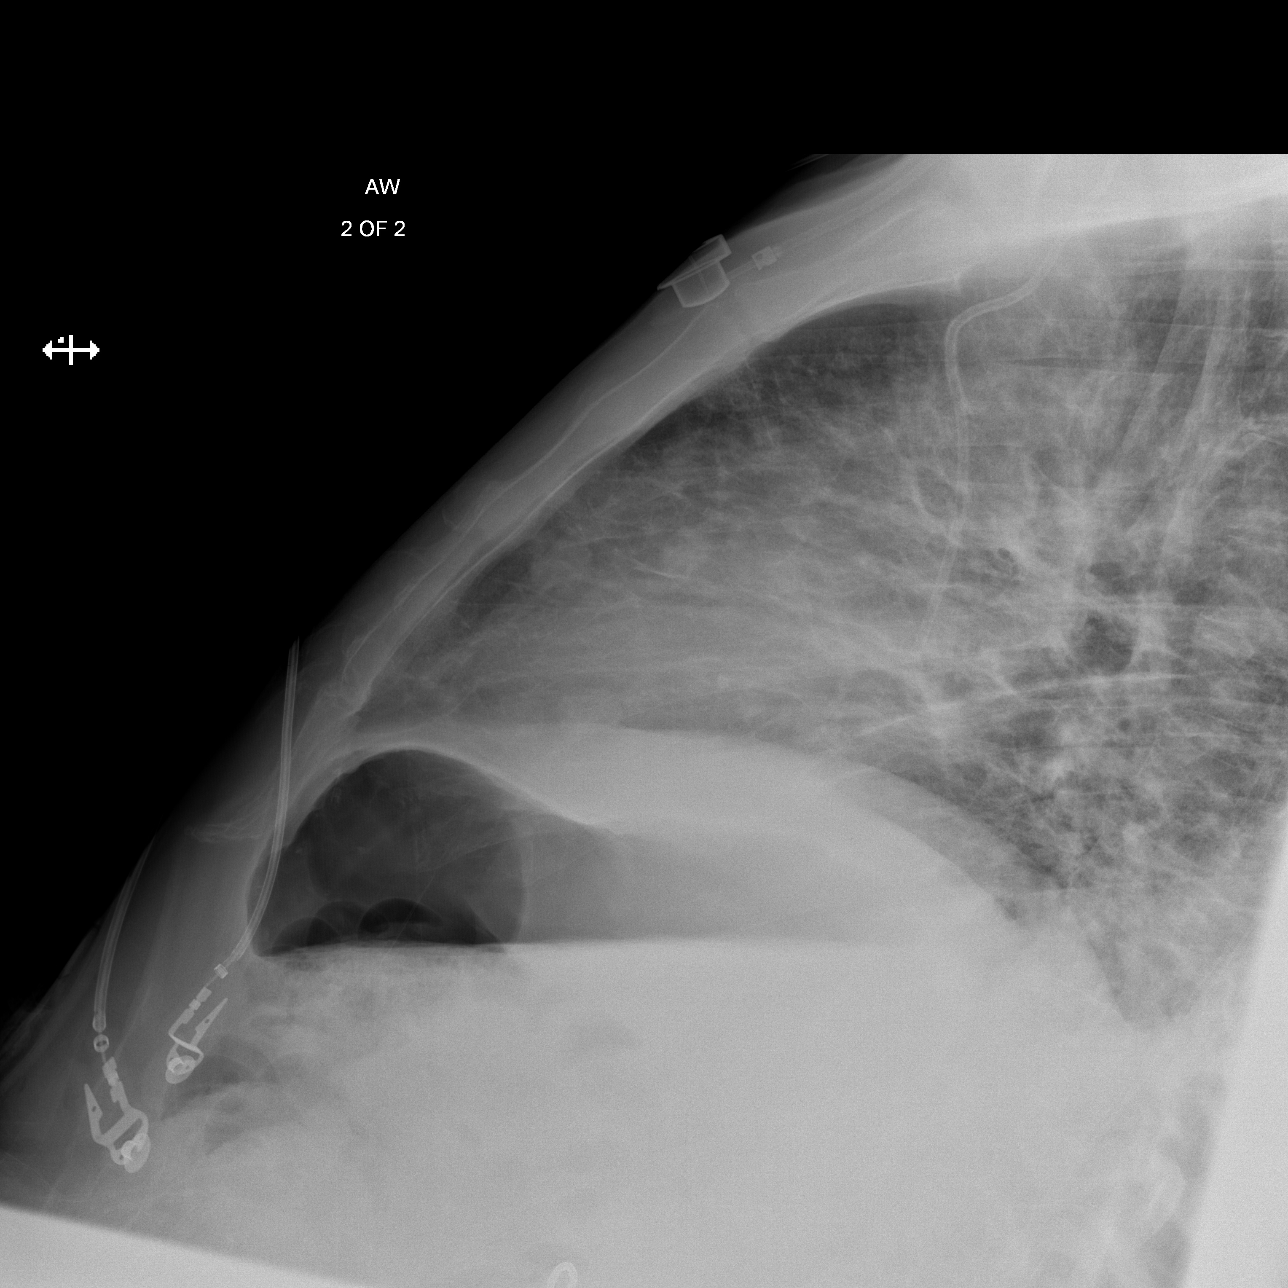

[3 of 3 positions shown; findings below may reference images not displayed]

PROCEDURE:     DXR - DXR CHEST PA (OR AP) AND LATERAL  - February 27, 2012  [DATE]

RESULT:     Left-sided Port-A-Cath device is present. The tip of the
catheter is in the junction region of the superior vena cava and right
atrium. There is patchy density in the left midlung and left lung base and
to a lesser extent in the right lung base and right midlung. The heart is
normal. There is hypoinflation. There is no significant effusion although
trace effusions appear present bilaterally. There is no pneumothorax.
IMPRESSION: 1. Hypoinflation.
2. Patchy densities in both lungs which could represent bilateral pneumonia.
Correlate clinically. The nodular density seen in the left lung lower third
is not definitely identified at this time.

[REDACTED]

## 2013-05-31 IMAGING — CR DG CHEST 1V PORT
1 series · 1 of 1 positions shown · non-contrast
Comparison: none

REASON FOR EXAM: Shortness of breath
COMMENTS:

PROCEDURE:     DXR - DXR PORTABLE CHEST SINGLE VIEW  - March 01, 2012  [DATE]
RESULT:     Comparison is made to prior study dated 02/27/2012.

[ap]
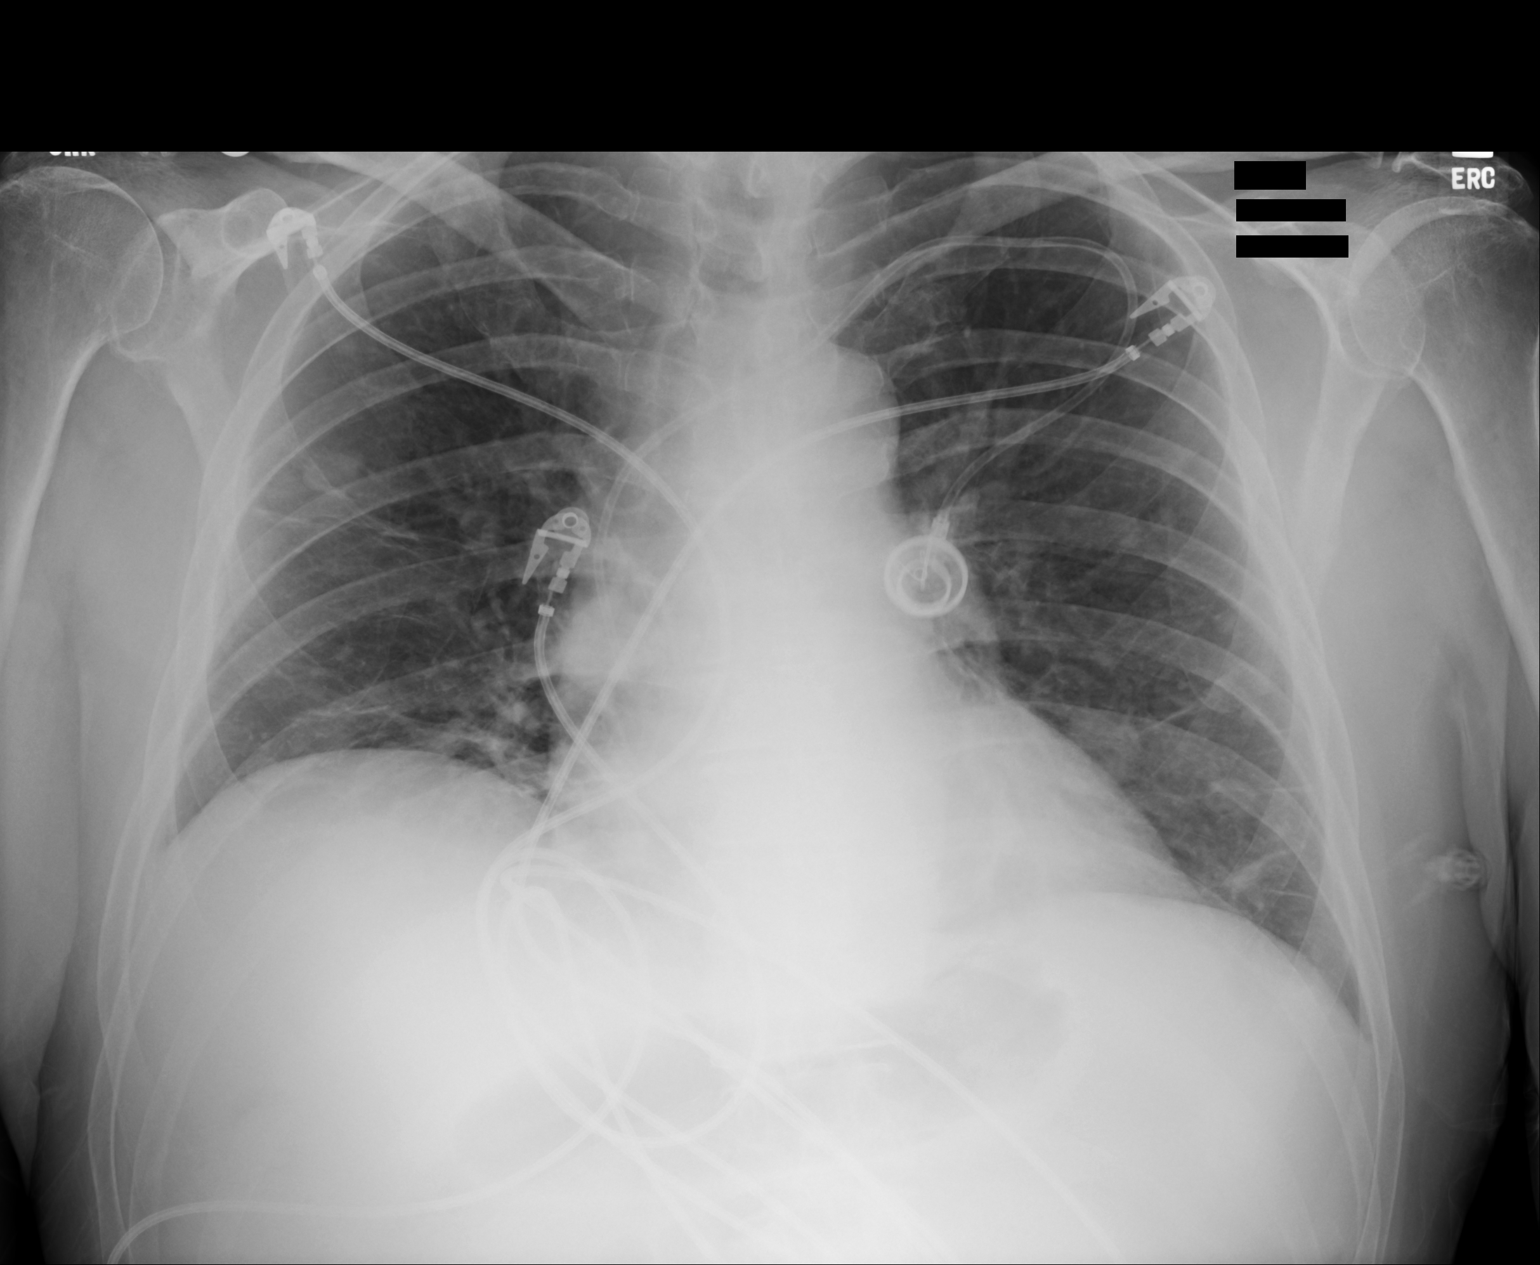

[1 of 1 positions shown; findings below may reference images not displayed]

FINDINGS: There is decreased conspicuity of the interstitial findings as
well as decreased conspicuity of the areas of ill-defined density. No new
focal regions of consolidation or new focal infiltrates are appreciated. A
left-sided Port-A-Cath is appreciated with the tip projecting in the region
of the superior vena cava right atrial junction. The cardiac silhouette is
mildly enlarged. The visualized bony skeleton is unremarkable.
IMPRESSION: 1. Findings consistent with improved interstitial findings.
2. Note; not mentioned above, an ill-defined nodular area projects in the
periphery of the right hemithorax. This may represent a confluence of
densities. Surveillance evaluation and attention to this area on subsequent
imaging is recommended.

## 2014-06-14 NOTE — Consult Note (Signed)
PATIENT NAME:  [name], [name] MR#:  [id number] DATE OF BIRTH:  03-14-1955  DATE OF CONSULTATION:  01/28/2012  REFERRING PHYSICIAN:   CONSULTING PHYSICIAN:  Knute Neuobert G. Robbie Rideaux, MD  HISTORY OF PRESENT ILLNESS: [name] is a 59 year old patient who was admitted on 12/02 with rapid atrial fibrillation, had some short of breath for a few days and some hiccups and burping but his heart rate was 170s and he was admitted to the hospital. He was started on IV Cardizem drip and initially put in the Intensive Care Unit. When I saw him on 12/03 he had already been transferred out of the Intensive Care Unit, was on oral Cardizem, although he was still in fibrillation. He has been started on Thorazine for hiccups and continued on his regular medications. In addition, he was given IV Levaquin for urinary tract infection that later was Staphylococcus and was sensitive to Levaquin. When patient seen again he was recovered from acute cardiac event, although he is still in fibrillation at a controlled rate.   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 He had deep vein thrombosis in the left leg back in August 2012, has been on Coumadin. He has history of surgery for small bowel obstruction. Has kidney stones. He is status post urethral stenting. He has had intermittent hematuria. He has mild Parkinson's disease. He is status post port placement, abdominal hernia repair, bunionectomy and wisdom teeth extraction, cancer resection and placement of stents.   ALLERGIES: He has an allergy to penicillin.   FAMILY HISTORY: His father had prostate cancer. Uncle had bladder cancer.   SOCIAL HISTORY: No alcohol or tobacco.   MEDICATIONS AT TIME OF ADMISSION: 1. Decadron 4 mg p.o. b.i.d.  2. Fentanyl 37.5 mcg patches.  3. Gabapentin 300 mg t.i.d.  4. Omeprazole 20 mg daily.   5. Oxycodone 5 mg every six hours p.r.n. for breakthrough. 6. Potassium 20 mEq daily.  7. Stivarga 80 mg daily initially 21 day cycle. 8. Tandem daily. 9. Coumadin 5 mg five days a week.   SYSTEM REVIEW: When I saw him he was comfortable and did not have any headache, dizziness, chills, or sweats, cough, or wheezing. He has some breakthrough pain but it is controlled, it is at static levels. He had no constipation or diarrhea. He has an ostomy. He had no dysuria though later he developed gross hematuria. He was not nauseated or having hiccups or burping when I evaluated him. No focal weakness. No numbness to any extremities. Has some chronic edema of the left lower leg, slightly greater than right. He had no other focal weakness. No numbness or tingling. His mood and affect were calm and appropriate   PHYSICAL EXAMINATION:  HEENT: Sclerae clear. Mouth has no thrush.   LYMPH: No palpable lymph nodes in neck, supraclavicular, submandibular, axilla.   LUNGS: Clear. No wheezing or rales.   HEART: Nonregular.   ABDOMEN: Abdomen was not tender. The ostomy was unremarkable.    EXTREMITIES: The left pretibial area slightly greater than on the right. No tenderness. No ankle swelling. No gross focal weakness.   PSYCH: Alert and cooperative.  LABORATORY, DIAGNOSTIC AND RADIOLOGICAL DATA: His labs on admission: PT/INR 2.0, white count 11.3, hemoglobin 9.4, platelets 176. Liver functions were normal. Cardiac enzymes were normal. TSH was normal. His chest CT to rule out PE and it  looked like increase in size in the lung nodules compared to his last scan.   IMPRESSION AND PLAN: Controlled atrial fibrillation. No evidence of MI or other acute cardiac event. No evidence of PE. Chronic deep vein thrombosis and has been on Coumadin which is maintained. He has had steady progression of cancer and is now on a targeted agent, Stivarga. No evidence at this time that the new drug has caused any of his problems.  He is being treated for urinary infection and he has no sepsis, he has no new sites of cancer. He seems to have slow progression of known sites of cancer in the lung. He has back pain from known bone disease. He is status post palliative radiation. I would not change his other chronic medications are as pain medicines at this time although I think we can look at reducing his maintenance Decadron. Follow cardiology recommendations. Could convert him to p.o. Levaquin and watch the urine. Continue outpatient follow up on discharge. Nothing else acutely from oncology.  ____________________________ Knute Neu Lorre Nick, MD rgg:cms D: 01/29/2012 22:29:00 ET T: 01/30/2012 09:37:50 ET JOB#: [id number]  cc: Knute Neu. Lorre Nick, MD, <Dictator>  Marin Roberts MD ELECTRONICALLY SIGNED 02/11/2012 22:03

## 2014-06-14 NOTE — Consult Note (Signed)
Brief Consult Note: Diagnosis: AF.   Patient was seen by consultant.   Consult note dictated.   Comments: REC  Agree with current therapy, up titrate diltiazem to 12 mg/hr, cont warfarin, convert to po cardizem in am after adequate rate control.  Electronic Signatures: Marcina MillardParaschos, Kyrstan Gotwalt (MD)  (Signed 02-Dec-13 16:50)  Authored: Brief Consult Note   Last Updated: 02-Dec-13 16:50 by Marcina MillardParaschos, Taeshawn Helfman (MD)

## 2014-06-14 NOTE — H&P (Signed)
PATIENT NAME:  [name], Talvin W MR#:  [id number] DATE OF BIRTH:  12/25/1955  DATE OF ADMISSION:  01/27/2012  PRIMARY CARE PHYSICIAN: Dr. Lorre NickGittin  REQUESTING PHYSICIAN: Dr. Cyril LoosenKinner   CHIEF COMPLAINT: Palpitations and shortness of breath.   HISTORY OF PRESENT ILLNESS: Patient is a 59 year old male with known history of metastatic colon cancer. He is being admitted for rapid atrial fibrillation. Patient has been having some shortness of breath for last three days, also has been feeling tired, started having hiccups for last two days along with burping. This morning he was feeling really tired. He has noticed his heart rate to be as fast as up to 176. His blood pressure was low although he does not remember the numbers. EMS was called and he was brought into the Emergency Department. While in the ED he was still in rapid atrial fibrillation, was started on Cardizem drip. His heart rate was up to 184 and he is being admitted for further evaluation and management.   PAST MEDICAL HISTORY:  1. Metastatic adenocarcinoma diagnosed in November 2005, followed by Dr. Lorre NickGittin, status post several surgical procedures with colostomy. 2. Recurrent cancer.  3. Left lower extremity deep vein thrombosis in August 2012 on Coumadin.  4. History of partial small bowel obstruction.  5. Nephrolithiasis status post ureteral stent on both sides, initially plastics in April 2013 followed by metal in July 2013.  6. Parkinson's disease.   PAST SURGICAL HISTORY:  1. Port-A-Cath placement.  2. Abdominal hernia repair.  3. Bunionectomy.  4. Wisdom teeth extraction.  5. Hartmann procedure and relocation of colostomy.   ALLERGIES: Penicillin.   FAMILY HISTORY: Father with prostate cancer. Uncle with bladder cancer. Mother with thyroid disease. Paternal uncle with brain cancer and another paternal uncle with laryngeal cancer. Another paternal uncle with Parkinson's disease.   SOCIAL HISTORY: [place] in [place].  No tobacco, alcohol or illicit drug use.    MEDICATIONS AT HOME:  1. Dexamethasone 4 mg p.o. b.i.d.  2. Fentanyl 37.5 mcg patch every three days.  3. Gabapentin 300 mg p.o. t.i.d.  4. Omeprazole 20 mg p.o. daily.  5. Oxycodone 5 mg p.o. every six hours.  6. Potassium chloride 20 mEq p.o. daily. 7. Stivarga 40 mg 2 tablets p.o. daily for 21 days.  8. Tandem 1 capsule p.o. daily.  9. Warfarin 5 mg p.o. daily except Monday and Thursday.   REVIEW OF SYSTEMS: CONSTITUTIONAL: Positive for tiredness. No fever or weakness. EYES: No blurry or double vision. ENT: No tinnitus or ear pain. RESPIRATORY: No cough, wheezing, hemoptysis. CARDIOVASCULAR: Positive for rapid atrial fibrillation and palpitations. No chest pain. GASTROINTESTINAL: Positive for nausea and continuous hiccups along with burping for last two days. GENITOURINARY: No dysuria, hematuria. History of nephrolithiasis status post stenting on both sides of ureters in July 2013. ENDOCRINE: No polyuria or nocturia. HEMATOLOGY: No anemia or easy bruising. SKIN: Old healed surgical scar. MUSCULOSKELETAL: No arthritis or muscle cramp. NEUROLOGIC: No tingling, numbness, weakness. PSYCHIATRIC: No history of anxiety or depression.   PHYSICAL EXAMINATION:  VITAL SIGNS: Temperature 98.4, heart rate 184 per minute, respirations 28 per minute, blood pressure 105/91 mmHg. He is saturating 98% on room air.   GENERAL: Patient is a 59 year old male lying in the bed comfortably without any acute distress.   EYES: Pupils equal, round, reactive to light and accommodation. No scleral icterus. Extraocular muscles intact.   HEENT: Head atraumatic, normocephalic. Oropharynx and nasopharynx clear.   NECK: Supple. No jugular venous distention.  No thyroid enlargement or tenderness.   LUNGS: Clear to auscultation bilaterally. No wheezing, rales, rhonchi, crepitation.   CARDIOVASCULAR: Irregularly irregular heart sounds. No murmurs, rubs, gallop.   ABDOMEN: Soft,  nontender, nondistended. Bowel sounds present. No organomegaly or masses. He has a colostomy bag in the abdomen working well.   EXTREMITIES: No pedal edema, cyanosis or clubbing.   NEUROLOGIC: Nonfocal examination. Cranial nerves III through XII intact. Muscle strength 5/5 in all extremities. Sensation intact.   PSYCH: Patient is oriented to time, place, and person x3.    SKIN: Old healed surgical scar.   LABORATORY, DIAGNOSTIC, AND RADIOLOGICAL DATA:  Normal BMP except phosphorous of 2.3.   Normal liver function tests. Normal first set of cardiac enzymes.   TSH 0.24, FT4 0.86.   CBC showed white count 11.3, hemoglobin 9.4, hematocrit 29.2, platelets 176.   PT 22.7, INR 2.0, PTT 40.9.   Urinalysis showed 1+ bacteria, 216 WBCs, 3+ leukocyte esterase, and WBC in clumps present.   Chest x-ray while in the ED showed bilateral pulmonary nodules concerning for metastatic disease. CT scan of the chest with PE protocol on 12/02 showed no evidence of PE. Multiple pulmonary parenchymal nodules of varying sizes which appear to have increased in size. Enlarged cardiac chambers. No evidence of congestive heart failure. No bulky mediastinal or hilar lymphadenopathy.   EKG shows atrial fibrillation with RVR. No major ST-T changes.   IMPRESSION AND PLAN:  1. Atrial fibrillation with rapid ventricular rate. Will start him on IV Cardizem drip. Admit him to Critical Care Unit. Consult cardiology. Obtain 2-D echo. 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 Will obtain serial troponins and watch him on telemetry.   2. History of deep venous thrombosis and atrial fibrillation on Coumadin with therapeutic INR. Will continue Coumadin at this time. His CT of the chest is negative for PE. Will monitor him daily coags. 3. History of colorectal cancer. Will consult Dr. Lorre Nick. This looks like metastatic disease with increase in the size of pulmonary nodules. Will let Dr. Lorre Nick  decide further course including chemotherapy.  4. Hiccups. Will start him on Thorazine for now. This could be due to chemotherapy.  5. Urinary tract infection based on urinalysis. Will obtain urine culture, sensitivity. Will start him on IV Levaquin at this time.  6. Leukocytosis, likely due to urinary tract infection. Will monitor while on antibiotic.  7. CODE STATUS: DO NOT RESUSCITATE.   TOTAL TIME TAKING CARE OF THIS PATIENT: 55 minutes.   ____________________________ Ellamae Sia. Sherryll Burger, MD vss:cms D: 01/27/2012 16:47:22 ET T: 01/27/2012 17:25:47 ET JOB#: [id number]  cc: Hiliary Osorto S. Sherryll Burger, MD, <Dictator> Knute Neu. Lorre Nick, MD  Patricia Pesa MD ELECTRONICALLY SIGNED 01/28/2012 21:09

## 2014-06-14 NOTE — Discharge Summary (Signed)
PATIENT NAME:  [name], [name] MR#:  [id number] DATE OF BIRTH:  [birth year]  DATE OF ADMISSION:  01/27/2012 DATE OF DISCHARGE:  01/30/2012  DIAGNOSES:  1. Atrial fibrillation with rapid ventricular response. 2. Metastatic colon cancer. 3. Staph aureus urinary tract infection.  4. 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  5. History of deep venous thrombosis.  6. Anemia.  7. Hyperglycemia.  8. Hiccups. 9. History of small bowel obstruction in the past.  10. Nephrolithiasis status post bilateral ureteral stents. 11. Parkinson's disease.   DISPOSITION: The patient is being discharged home with Home Health.   DIET: Low sodium.   ACTIVITY: As tolerated.   FOLLOWUP: Follow up with Dr. Darrold JunkerParaschos and Dr. Lorre NickGittin 1 to 2 weeks after discharge. The patient has been advised to hold his Coumadin on 12/5 and 12/6 and restart it on 12/7. He will get a PT-INR check on Saturday, 02/01/2012. Blood will be drawn by Home Health.   DISCHARGE MEDICATIONS:  1. Tandem 162/115.2 mg, 1 capsule once a day.  2. KCl 20 mEq daily.  3. Fentanyl patch 37.5 mcg q. 72 hours.  4. Decadron 2 mg 2 tablets b.i.d.  5. Oxycodone 5 mg q. 6 hours p.r.n. pain. 6. Stivarga 40 mg, 2 tablets once a day.  7. Omeprazole 20 mg daily.  8. Neurontin 300 mg t.i.d.  9. Coumadin 5 mg daily except on Monday and Thursday. 10. Lopressor 25 mg b.i.d.  11. Cardizem CD 300 mg daily.  12. Nitrofurantoin 100 mg b.i.d. for five days.   CONSULTATION: Cardiology consultation with Dr. Darrold JunkerParaschos.  LABORATORY, DIAGNOSTIC, AND RADIOLOGICAL DATA: CT of the chest showed no evidence of acute PE, multiple parenchymal nodules which appear increased in size from studies of July and October. No bulky lymphadenopathy seen. Echo showed moderate MR, mild MR, ejection fraction 55%. No pericardial effusion, normal systolic function.   Microbiology: Urine cultures- Staph aureus.   Free T3 low at 1.5. T4 normal. TSH slightly low.  INR 2- 2.8. White count  11.3 to 6.6, hemoglobin 9.4. Normal platelet count. Glucose 175, BUN 21, creatinine 0.98. Cardiac enzymes normal. The rest of the LFTs normal.   HOSPITAL COURSE: The patient is a 59 year old male with history of metastatic colon cancer who presented with palpitations and shortness of breath. He was found to be in atrial fibrillation with rapid ventricular response. The patient reported that he had one similar episode in 2005 when his colon cancer was diagnosed. He was initially in the Intensive Care Unit on a Cardizem drip and subsequently was started on oral Cardizem and was able to be weaned off the Cardizem drip. With exertion the patient's heart rate accelerated;  therefore, a beta blocker was also added to his treatment regimen. His cardiac enzymes were negative. Echocardiogram showed preserved ejection fraction. He was on Coumadin for deep vein thrombosis. He was treated with Levaquin initially for a urinary tract infection which resulted in elevation of his INR. The patient has been advised to hold his Coumadin for two days. His INR will be checked in two days by his Home Health agency. He has been advised to resume his Coumadin as his INR is in the therapeutic range and not elevated. CT of the chest shows that his pulmonary nodules have increased in size. He recently started a new chemotherapy drug 2 to 3 days prior to admission. Urine culture grew Staph aureus for which he is being treated with Macrodantin. He was found to have borderline low TSH but his T4  was normal. T3 was slightly low. 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 He is being discharged home in stable condition.       TIME SPENT: 45 minutes.    ____________________________ Darrick Meigs, MD sp:bjt D: 01/30/2012 15:22:48 ET T: 01/31/2012 09:00:47 ET JOB#: [id number]  cc: Darrick Meigs, MD, <Dictator> Knute Neu. Lorre Nick, MD Marcina Millard, MD Darrick Meigs MD ELECTRONICALLY SIGNED 01/31/2012 14:20

## 2014-06-14 NOTE — Op Note (Signed)
PATIENT NAME:  [name], [name] MR#:  [id number] DATE OF BIRTH:  January 24, 1956  DATE OF PROCEDURE:  09/24/2011  PREOPERATIVE DIAGNOSES:  1. Bilateral hydronephrosis. 2. Metastatic colon cancer.    POSTOPERATIVE DIAGNOSES: 1. Bilateral hydronephrosis. 2. Metastatic colon cancer.    PROCEDURES:  1. Cystoscopy.  2. Bilateral double-J ureteral stent change.   SURGEON: Madolyn FriezeBrian S. Achilles Dunkope, MD   ANESTHESIA: Laryngeal mask airway anesthesia.   INDICATIONS: The patient is a 59 year old white gentleman with a history of metastatic colon cancer and retroperitoneal lymphadenopathy. He recently developed the onset of bilateral hydronephrosis related to the adenopathy with onset of renal failure. He underwent stent placement. He presents for routine stent change.   PROCEDURE: After informed consent was obtained, the patient was taken to the operating room and placed in the dorsal lithotomy position under laryngeal mask airway anesthesia. The patient was then prepped and draped in the usual standard fashion. The 22 French rigid cystoscope was introduced into the urethra under direct vision with no urethral abnormalities noted. Upon entering the prostatic fossa, moderate bilobar hypertrophy was visualized with partial visual obstruction. Upon entering the bladder, the mucosa was inspected in its entirety with no gross mucosal lesions noted except for mild inflammatory changes surrounding the ureteral orifices. Stents were noted protruding from the right and left ureteral orifice. Mild to moderate encrustation was present. Grasping forceps were utilized. The right stent was removed without difficulty. A guidewire was then advanced into the right ureter into the upper pole collecting system under fluoroscopic guidance without difficulty. A resonant stent sheath was placed over the guidewire without difficulty. The obturator was removed. A 6 French x 24 cm double-J ureteral stent was advanced through the sheath into the renal  pelvis. Withdrawal of the sheath resulted in good curl within the kidney. Good curl was also noted within the urinary bladder. Good positioning was noted. The left stent was similarly grasped and removed without difficulty utilizing grasping forceps. A guidewire was advanced into the upper pole collecting system under fluoroscopic guidance without difficulty. The sheath was then advanced over the guidewire. The obturator was removed. A second 6 French x 24 cm double-J resonant stent was advanced through the sheath into the upper pole collecting system. Adequate curl was noted within the renal pelvis. Adequate curl was also noted within the urinary bladder. The bladder was then drained. The cystoscope was removed. The patient was returned to the supine position and awakened from laryngeal mask airway anesthesia. He was taken to the recovery room in stable condition. There were no problems or complications. The patient tolerated the procedure well.   ____________________________ Madolyn FriezeBrian S. Achilles Dunkope, MD bsc:drc D: 09/25/2011 08:03:08 ET T: 09/25/2011 11:36:26 ET JOB#: [id number]  cc: Madolyn FriezeBrian S. Achilles Dunkope, MD, <Dictator> Madolyn FriezeBRIAN S Reign Bartnick MD ELECTRONICALLY SIGNED 10/03/2011 7:49

## 2014-06-14 NOTE — Consult Note (Signed)
Brief Consult Note: Diagnosis: 1. Fever - multiple sources include PNA, endocarditis, 2. New CHF with AI on recent echo - ? endocarditis 3. recent MSSA UTI - concern again for endocarditis.   Patient was seen by consultant.   Consult note dictated.   Recommend further assessment or treatment.   Orders entered.   Comments: Rec cards consult given evidence of CHF but borderline BP and Aortic insufficiency on recent echo Cont current broad abx check Bcx Consider repeat echo to eval for endocarditis Thank you for consult. Please call with questions.  Electronic Signatures: Dierdre HarnessFitzgerald, Evangelina Delancey Patrick (MD)  (Signed 30-Dec-13 22:28)  Authored: Brief Consult Note   Last Updated: 30-Dec-13 22:28 by Dierdre HarnessFitzgerald, Quantae Martel Patrick (MD)

## 2014-06-14 NOTE — Consult Note (Signed)
Brief Consult Note: Diagnosis: PAF, exacerbated by pneumonia, fever, lung mets, anemia.   Patient was seen by consultant.   Consult note dictated.   Comments: REC  Agree with current therapy, treat underlying cause, consider transfusion, cont current meds, consider uptitrate metop, consider adding antiarrythmic pending patient's initial clinical course.  Electronic Signatures: Marcina MillardParaschos, Dallin Mccorkel (MD)  (Signed 31-Dec-13 09:19)  Authored: Brief Consult Note   Last Updated: 31-Dec-13 09:19 by Marcina MillardParaschos, Johany Hansman (MD)

## 2014-06-14 NOTE — Consult Note (Signed)
PATIENT NAME:  [name], [name] MR#:  [id number] DATE OF BIRTH:  [birth year]  DATE OF CONSULTATION:  01/27/2012  REFERRING PHYSICIAN:  Delfino LovettVipul Shah, MD CONSULTING PHYSICIAN:  Marcina MillardAlexander Chaniece Barbato, MD  PRIMARY CARE PHYSICIAN: Benita Gutterobert Gittin, MD  REASON FOR CONSULTATION: Evaluation of atrial fibrillation.   HISTORY OF PRESENT ILLNESS: The patient is a 59 year old gentleman referred for evaluation of atrial fibrillation. The patient has a history of colorectal cancer undergoing treatment per Dr. Lorre NickGittin. The patient had an episode of atrial fibrillation in 2005 without recurrence. The patient was in his usual state of health. Home health nurse noted that the patient was tachycardic and was sent to [facility] where he was noted to be in atrial fibrillation with a rapid ventricular rate. The patient denies chest pain. The patient was started on Cardizem drip.   PAST MEDICAL HISTORY:  1.  Colorectal cancer with lung metastasis followed by Dr. Lorre NickGittin.  2. History of atrial fibrillation.  3. History of deep vein thrombosis on chronic Coumadin therapy.   MEDICATIONS:  1.  Warfarin 5 mg daily.  2. Stivarga 80 mg daily for 21 days.  3. Oxycodone q.6 p.r.n.  4. Potassium 20 mEq 1 daily.  5. Tandem 1 capsule daily.  6. Fentanyl transdermal patch every three days.  7. Dexamethasone 2 mg 2 tablets b.i.d.  8. Omeprazole 20 mg daily.   SOCIAL HISTORY: The patient is married. Resides with his wife. He denies tobacco abuse.   FAMILY HISTORY: No immediate family history of coronary artery disease or myocardial infarction.   REVIEW OF SYSTEMS: CONSTITUTIONAL: No fever or chills. EYES: No blurry vision. EARS: No hearing loss. RESPIRATORY: No shortness of breath. CARDIOVASCULAR: No chest pain. GASTROINTESTINAL: No nausea, vomiting, diarrhea, or constipation. GU: No dysuria or hematuria. ENDOCRINE: No polyuria or polydipsia. MUSCULOSKELETAL: No arthralgias or myalgias. NEUROLOGICAL:  No focal muscle weakness or numbness. PSYCHOLOGICAL: No depression or anxiety.   PHYSICAL EXAMINATION:  VITAL SIGNS: Blood pressure 124/71, pulse 118, respirations 23, temperature 98.1, and pulse oximetry 99%.   HEENT: Pupils equal and reactive to light and accommodation.   NECK: Supple without thyromegaly.   LUNGS: Clear.   HEART: Normal jugular venous pressure. Normal point of maximal impulse. Irregular regular rhythm. Normal S1, S2. No appreciable gallop, murmur, or rub.   ABDOMEN: Soft and nontender. Pulses were intact bilaterally.   MUSCULOSKELETAL: Normal muscle tone.   NEUROLOGIC: The patient is alert and oriented x3. Motor and sensory both grossly intact.   IMPRESSION: This is a 59 year old gentleman with atrial fibrillation with a rapid ventricular rate partially controlled on Cardizem bolus and drip.   RECOMMENDATIONS:  1. Increase Cardizem drip to 12 mg/h.  2. Continue Warfarin.  3. Review 2-D echocardiogram.  4. Once the patient's rate is controlled, will likely convert from IV to P.O. Cardizem in the am.  ____________________________ Marcina MillardAlexander Nyjah Schwake, MD ap:rbg D: 01/27/2012 16:50:03 ET T: 01/28/2012 08:29:34 ET  JOB#: [id number] Lyn HollingsheadALEXANDER Jama Krichbaum MD ELECTRONICALLY SIGNED 02/07/2012 14:28

## 2014-06-17 NOTE — Consult Note (Signed)
Brief Consult Note: Diagnosis: Metastatic and treatment resistent colorectal cancer.   Consult note dictated.   Comments: SEE DICTATED NOTE  NOTHING ACUTELY FROM ONCOLOGY  PATIENT EMBRACING HOSPICE AND COMFORT CARE AND TRANSFER TO HOSPICE HOME IS A REASONABLE OPTION AT THIS TIME.  Electronic Signatures: Marin RobertsGittin, Adana Marik G (MD)  (Signed 06-Jan-14 23:56)  Authored: Brief Consult Note   Last Updated: 06-Jan-14 23:56 by Marin RobertsGittin, Betzaida Cremeens G (MD)

## 2014-06-17 NOTE — Discharge Summary (Signed)
PATIENT NAME:  [name], [name] MR#:  [id number] DATE OF BIRTH:  May 13, 1955  DATE OF ADMISSION:  02/24/2012 DATE OF DISCHARGE:  03/03/2012  ADMITTING PHYSICIAN: Hope PigeonVaibhavkumar G. Elisabeth PigeonVachhani, MD DISCHARGING PHYSICIAN: Enid Baasadhika Shahana Capes, MD PRIMARY ONCOLOGIST AND PHYSICIAN: Benita Gutterobert Gittin, MD    HOSPITAL CONSULTATIONS:   1. ID consultation by Dr. Clydie Braunavid Fitzgerald. 2. Cardiology consultation with Dr. Darrold JunkerParaschos.  3. Palliative Care consultation by Dr. Harriett SineNancy Phifer.  4. Oncology consultation by Dr. Lorre NickGittin.    DISCHARGE DIAGNOSES: 1. Healthcare-acquired pneumonia.  2. Atrial fibrillation with rapid ventricular response.  3. Congestive heart failure, acute on chronic systolic, with ejection fraction of 45%.  4. Back pain from enlarged retroperitoneal lymph nodes which are malignant, pressing on the lumbar spine at L2 level.  5. Metastatic recurrent colon cancer with lung metastasis and also lymph nodal spread.   DISCHARGE MEDICATIONS:  1. Tandem 162 mg, 1 capsule p.o. daily.  2. Omeprazole 20 mg p.o. b.i.d.  3. Gabapentin 300 mg p.o. t.i.d.  4. Stivarga 80 mg p.o. daily, which is targeted chemotherapy for his metastatic colon cancer. Cardizem 300 mg p.o. daily.  5. Oxycodone 5 mg every 4 hours as needed for pain.  6. Warfarin 4 mg p.o. daily.  7. Metoprolol 12.5 mg p.o. b.i.d.  8. Sotalol 40 mg p.o. b.i.d.  9. Levaquin 750 mg p.o. daily until 03/07/2012.  10. Fentanyl patch 37 mcg patch along with a 12 mcg patch topically every 3 days.  11. Lasix 20 mg p.o. every other day.   DISCHARGE DIET: Low-sodium diet.   DISCHARGE ACTIVITY: As tolerated.   HOME OXYGEN: 2 liters.   FOLLOW-UP INSTRUCTIONS: The patient is being transferred to hospice home.   LABORATORY, DIAGNOSTIC AND RADIOLOGICAL DATA:  INR at the time of discharge 2.1. WBC 5.4, hemoglobin 9.9, hematocrit 29.5, platelet count 476. Sodium 137, potassium 4.6, chloride 99, bicarbonate 31, BUN 13, creatinine 0.96, glucose 88, and calcium  of 8.4. Sputum cultures are negative so far.   Chest x-ray from January 5th showing  interstitial findings and confluence of densities are present, probably from his cancer. Eecho showed moderately reduced LV systolic function, ejecEFtion fraction of 45 to 50%, and moderate mitral regurgitation is presentand disease.   BRIEF HOSPITAL COURSE: The patient is a 56-[name] year-old Caucasian male with past medical history significant for colon cancer originally diagnosed 8eight years ago,  treated,310 but recurrence of cancer which has been refractory to current treatment with metastasis tomedicine his lungs and also and retroperitoneal lymph nodes with history of atrial fibrillation, was brought to the hospital secondary to cough and fever. HPC8e he also had recent hospitalization for aa fibtrial fibrillation with RVR and also UTIused.   1. HIs not healthcare- acquired pneumonia: Chest x-ray with basilar infiltrate. With his deep venous thrombosis. His recent hospitalization and his immunosuppression from his chemo, forhe keep from ischemia. He was treated with broad-spectrum antibiotics for his healthcare- acquired pneumonia.  Ms. He was  seen by IDinfectious disease initially; and then  now bound since he has been afebrile, c. Cultures have been being negative, his , his antibiotics were changed to Levaquin at this time.  2. Atrial fibrillation with rapid ventricular response: L likely triggered by his underlying illness and also pain. He was seen by Ccardiology. His Cardizem was increased to 300 mgthree and milligrams daily and also metoprolol low dose was started, 12.5 mg  for milligrams p.o. daily, and he wasis also started on sotalol 40 mg p.o. b.i.d. ThePC8 strengths  on these medications could not be increased as it is limited by his low blood pressure at this time. Nexium  3. Mmetastatic metastatic recurrent clicks and colon cancer with metastasis mets to lung . Mt causing his dyspnea, and also  tometastasis to  his retroperitoneal lymph nodes which arewere compressing on his lumbar spine, especially at the L2 region, from his recent PET scan from October 11/2011.  HePC8 is mostly bedbound at this time and, with assistance, uses a walker. . Uses Blocker Hhe has been outdated.on targeted chemo Now, trial medication and followed by Dr. Lorre Nick.Jae Dire Sspeaking to Dr. Lorre Nick, it Rosilyn Mings and seems like he is not responding that much back in March to the medication and has extensively been fairly poor prognosis. His wife and the patient droveare aware  a of this, and, by their request, the palliative care  tolerated well but colicky care team was consulted, andon the patient wanted to go to a hospice home. His chemotherapy medication by mouth will be been asked to up he continued at this time.  4. Acute on chronic to systolic congestive heart failure exacerbation while in the hospital: . He Now and it was given IV fluids when andhe was is initially admitted for pneumonia. Chest x-ray showeding interstitial edema, so he was and 30 diuresedis with Lasix; however, his blood pressure was limiting the diuresis, and at this time his . The Lasix dose has been is being changed to 20 mg every other day.  5. Back pain secondary to his metastasis: Improvedimproved with fentanyl patch, total of to remove 37 mcg and 12 mcg micrograms along with p.r.n. oxycodone.   His cCourses has been otherwise uneventful in the hospital. His back condition is poor. His condition is guarded with poor prognosis and.   DISCHARGE DISPOSITION: Hospice home.   TTIME SPENT ON DISCHARGE: ime Spent ON discharge for cc cystic duct getting 45 minutes.    ____________________________ Enid Baas, MD rk:cb D: 03/03/2012 14:14:50 ET T: 03/03/2012 20:14:19 ET JOB#: [id number]  cc: Enid Baas, MD, <Dictator>Estiben Mizuno Nemiah Commander, MD, <Dictator> Knute Neu. Lorre Nick, MD Enid Baas MD ELECTRONICALLY SIGNED 03/06/2012 14:30

## 2014-06-17 NOTE — Consult Note (Signed)
PATIENT NAME:  [name], [name] MR#:  [id number] DATE OF BIRTH:  1955/04/08  DATE OF CONSULTATION:  02/24/2012  REFERRING PHYSICIAN:  Altamese Dilling, MD and Prime Doc  CONSULTING PHYSICIAN:  Stann Mainland. Sampson Goon, MD  PRIMARY ONCOLOGIST:  Benita Gutter, MD  REASON FOR CONSULTATION: Pneumonia and fever.  HISTORY OF PRESENT ILLNESS: This is a very pleasant 59 year old gentleman with known metastatic and recurrent colon cancer under the care of Dr. Lorre Nick.  He has had a complicated last month with an admission in early December with palpitations and shortness of breath. At that time he was found to be in rapid A. fib and was admitted to the CCU on a drip. He was stabilized and discharged home on December 5th.  Of note, during that admission he grew staph aureus from a urinary culture. He also had an echocardiogram done which showed valvular dysfunction. He was at home doing relatively well until the last few days when he started to develop increasing shortness of breath and cough and fever. He is very weak, has difficulty even walking from his bed to his bedside commode. He was found again to be in A. fib with rapid ventricular response and to have a temperature of 102.5. He was admitted, started on broad-spectrum antibiotics.   PAST MEDICAL HISTORY: 1.  Metastatic colon cancer, diagnosed initially 8 years ago. Status post surgery, chemo and radiation. Currently undergoing oral palliative chemotherapy.  2.  Left lower extremity DVT in August 2012, currently on Coumadin.  3.  Nephrolithiasis status post stenting in 2013.  4.  Parkinson disease.   PAST SURGICAL HISTORY:  1.  Port-A-Cath placement.  2.  Abdominal hernia repair.  3.  Bunionectomy.  4.  Wisdom teeth extraction.  5.  Hartmann procedure and relocation of colostomy.   ALLERGIES: PENICILLIN.  FAMILY HISTORY: The patient's father had prostate cancer. Uncle with bladder cancer. Mother with thyroid disease.   SOCIAL HISTORY: He lives  in [place] with his wife. No tobacco, alcohol or illicit drug use.    CURRENT MEDICATIONS: 1.  Diltiazem 300 once a day. 2.  Fentanyl patch. 3.  Gabapentin 300 mg t.i.d. 4.  Levofloxacin 750 q.24 hours. 5.  Meropenem 500 q.8 hours. 6.  Metoprolol 25 mg b.i.d. 7.  Stivarga 80 mg daily. 8.  Tandem 1 capsule daily. 9.  Omeprazole 20 mg b.i.d. 10.  Roxicodone 5 mg q.4 hours p.r.n. pain. 11.  Promethazine p.r.n. 12.  Vancomycin 1.25 grams IV q.12 hours. 13.  Warfarin 2.5 mg q. 5:00 p.m.  REVIEW OF SYSTEMS:  Eleven systems reviewed and negative except as per history of present illness.   PHYSICAL EXAMINATION: VITAL SIGNS: The patient is febrile since admission with a temperature of 103.0, currently  98.4, pulse 80, blood pressure 97/61, sat 81% initially, up to 96% on 3 liters at rest.  GENERAL: He is a pleasant interactive gentleman lying in bed reading the newspaper. He is febrile and diaphoretic. He does seem somewhat tachypneic.  HEENT: His pupils equal, round, and reactive to light and accommodation. Extraocular movements are intact. Sclerae are anicteric. His oropharynx is clear.  NECK: Supple.  HEART:  Irregular. He has a II/VI systolic murmur.  LUNGS: With bibasilar crackles.  ABDOMEN: Soft, nontender, nondistended. No hepatosplenomegaly. CHEST WALL:  He has a Port-A-Cath in his left chest that appears intact.  EXTREMITIES:  1+ bilateral lower extremity edema.  NEUROLOGIC:  He is alert and nourished x 3. Grossly nonfocal neuro exam.   LABORATORY AND DIAGNOSTIC DATA:  Influenza testing for A and B was negative on admission.  Urinalysis with 21 white blood cells, 18 red blood cells. BNP is elevated at 2005. Chest x-ray shows new bilateral interstitial pulmonary opacities. Differential would include interstitial pulmonary edema and infection such as atypical infection. INR is 1.1, troponin is negative. Comprehensive panel shows BUN 13, creatinine 1.07, albumin is down at 1.8.  White  blood count 4.6, hemoglobin 8.2, platelets 192. EKG showed atrial fibrillation. Urine culture from prior admission grew staph aureus on December 4th.  There were 50,000 staph aureus colonies. This was a relatively sensitive organism.  It was not MRSA.  On December 2nd culture also grew 100,000 pansensitive MSSA.  Urinalysis on both of those dates, December 2nd, had 216 white blood cells and 62 white cells on December 4th.  Echocardiogram done on December 3rd revealed moderate aortic regurgitation, mild MR, mild TR, ejection fraction of 55%, normal atrial size.   IMPRESSION:  11. A 59 year old gentleman with metastatic colon cancer including to the lungs who has increasing shortness of breath, fever, chest x-ray with interstitial prominence, echocardiogram with moderate presumably new aortic insufficiency and urine culture positive for staph aureus.  I am concerned for multiple issues in this patient.  The first issue is I believe he has volume overload, likely due to aortic insufficiency. His chest x-ray shows interstitial prominence and his BNP is elevated. I would recommend starting Lasix although his blood pressure is borderline. He will likely need to have a repeat cardiology consult and consider a repeat echocardiogram.  2.  I am very concerned for endocarditis as methicillin-sensitive staph aureus urine culture results often suggest an endovascular source unless there was a recent Foley catheter or other urological procedure in the patient.  He did have stenting done in July and this could be a source.  I have put in for blood cultures although he has been receiving antibiotics. At this point I would recommend consideration of repeat echocardiogram to further evaluate the valves, looking for any vegetation.  I will continue his current antibiotics at this point.  3.  Thank you for the consult. I will be glad to follow along with you.  I did take the liberty of stopping his intravenous fluids, given the  elevated BNP and hypoxia. However, I would recommend cardiology consult to re-evaluate the valves.  Given his borderline blood pressure, I am hesitant to give him Lasix at this time. Please call with questions.     ____________________________ Stann Mainlandavid P. Sampson GoonFitzgerald, MD dpf:ct D: 02/24/2012 22:27:59 ET T: 02/25/2012 09:04:37 ET JOB#: [id number]  cc: Stann Mainlandavid P. Sampson GoonFitzgerald, MD, <Dictator> DAVID Sampson GoonFITZGERALD MD ELECTRONICALLY SIGNED 02/27/2012 14:23

## 2014-06-17 NOTE — H&P (Signed)
PATIENT NAME:  Jay, Baird MR#:  [id number] DATE OF BIRTH:  12/02/1955  DATE OF ADMISSION:  02/24/2012  PRIMARY CARE PHYSICIAN: Dr. Lorre Nick.   PRESENTING COMPLAINT: Cough and fever.  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 He was admitted to the hospital a few weeks ago for UTI, was treated with antibiotics, remained in the hospital a few days, and then sent home. After that also he had visit to the ER and was given another dose of antibiotics. Now, he came to the Emergency Room because for the last few days, his heart rate is remaining higher and he is feeling palpitation. He spoke to his nurse and she told him to increase the dose of Cardizem he is taking at home from 240 mg to 300 mg that he was taking for the last three days but did not help, and he also had cough and noticed fever of 102.5 degrees Fahrenheit at home, and so finally decided to come to the Emergency Room. On further workup in the Emergency Room, he was found having atrial fibrillation with rapid ventricular response rate up to 150. ER physician gave her one injection of Cardizem and heart rate dropped to normal, 100-110. On chest x-ray, they found he has new infiltrates bilateral and that is why he is being admitted for pneumonia   REVIEW OF SYSTEMS:  CONSTITUTIONAL: Positive for fever and mild generalized weakness. Denies any weight loss of weight gain.  EYES: Denies any burning, redness, or discharge from the eyes.  EARS: Denies any pain, discharge, or redness from the ears.  RESPIRATORY: Mild short of breath, has cough, dry, no sputum.  CARDIOVASCULAR: Tachycardia, feeling palpitation. Denies any dizziness or syncopal episode. Denies any edema of the legs.  ABDOMEN: Denies any nausea, vomiting, or diarrhea.  GENITOURINARY: Denies any burning in the urine or increased frequency.   MUSCULOSKELETAL: Denies any joint swelling, pain, or tenderness.  NEUROLOGICAL: Denies any gross weakness, focal weakness, or headache.  PSYCHIATRIC: Denies any acute psychiatric illness.   PAST MEDICAL HISTORY:  1. Metastatic adenocarcinoma diagnosed in November 2005 followed by Dr. Lorre Nick status post several surgical procedures with colostomy.  2. Recurrent cancer.  3. Left lower extremity deep vein thrombosis in August 2012 on Coumadin.  4. History of partial small bowel obstruction.  5. Nephrolithiasis status post ureteral stent on both sides initially, plastic in April 2013 followed by metal in 2013 July.  6. Parkinson's disease.  7. Atrial fibrillation.   PAST SURGICAL HISTORY:  1. Port-A-Cath placement for chemotherapy.  2. Abdominal hernia repair.  3. Bunionectomy. 4. Wisdom teeth extraction.  5. Hartmann procedure (Dictation Anomaly) colostomy.   ALLERGIES: PENICILLIN.   FAMILY HISTORY: Father with prostate cancer. Uncle with bladder cancer. Mother with thyroid disease. Paternal uncle with brain cancer, another paternal uncle with laryngeal cancer, and another paternal uncle with Parkinson's disease.   SOCIAL HISTORY: Lives in [place] with his wife. No tobacco, alcohol, or illicit drug use.  MEDICATIONS AT HOME:  1. Warfarin 2.5 mg 1 tablet daily.  2. Tandem 162 mg 1 capsule once a day. 3. Stivarga 40 mg oral tablet 2 tablets once a day.  4. Potassium chloride 20 mEq once a day.  5. Oxycodone 5 mg oral tablet every 6 hours as needed for pain.  6. Omeprazole 20 mg oral tablet 2 times a day.  7. Metoprolol 25  mg tablet 2 times a day.  8. Gabapentin 300 mg oral capsule 3 times a day.  9. Fentanyl 25 mcg per hour patch, one patch transdermally every 3 days. 10. Cardizem CD 300 mg, 24-hour capsule once a day.   PHYSICAL EXAMINATION:  VITAL SIGNS: Temperature 102.8, pulse rate on arrival to Emergency was 130 to 140, came down to 100, blood pressure 94/58, pulse ox 96%  on 2 liters oxygen supplementation.  GENERAL: He is fully alert and oriented to time, place, and person, and comfortable. No acute distress. He is cooperative with physical examination and history taking.  HEAD AND NECK: Atraumatic. Conjunctivae pink. Oral mucosa moist. Neck supple. No JVD. Hearing grossly intact.  RESPIRATORY: Few creps heard, but bilateral equal air entry, and no use of accessory muscles of the respiration.  CARDIOVASCULAR: S1, S2 present. Irregular and rapid. No murmurs appreciated.  ABDOMEN: Soft, nontender. Bowel sounds present. No organomegaly. Colostomy bag present.  SKIN: No rash.  EXTREMITIES: No edema.  NEUROLOGICAL: Power 5/5 all four limbs. No calf tenderness. No tremors.  PSYCHIATRY: Grossly does not appear in any gross psychiatric illness.  LABORATORY RESULTS: Glucose 109. BNP 2,005. BUN 13, creatinine 1.07, sodium 137, potassium 3.8, chloride 104, CO2 of 24, calcium 8.1, total protein 5.9, albumin 1.8, bilirubin total 0.4, alkaline phosphatase 114. SGOT 20, SGPT 20, and troponin less than 0.02. WBC 4.6, RBC 3.82, hemoglobin 8.6, hematocrit 27.8, platelet count 192, MCV 73. INR 1.1. Blood culture negative. Influenza test negative. Urinalysis: WBC 21 and leukocyte esterase 2+. Chest x-ray portable revealed a new bilateral interstitial pulmonary opacity. Differential will include interstitial pulmonary edema or infection.   ASSESSMENT AND PLAN:  1. Pneumonia. He was in the hospital three weeks ago and was treated with urinary tract infection since last month twice, so will give him broad-spectrum antibiotics and wait for ID consult. If responds well and improves, then will taper to oral antibiotics soon.  2. Atrial fibrillation with RVR. Heart rate slowed down with Cardizem injection in the Emergency Room. He is on Coumadin at home. INR, low. Will continue rate control with Cardizem oral and increase the dose of Coumadin, monitor on telemetry, and follow troponin.  3.  History of colon cancer with metastasis to lung. Oral chemotherapy status post colostomy. Will continue his chemotherapy and advised him to follow as his protocol as outpatient with Cancer Center. 4. History of deep vein thrombosis. He is on Coumadin. INR is subtherapeutic. Will increase the dose of Coumadin and follow INR.  5. Deep vein thrombosis and GI prophylaxis. For now, we will give DVT prophylaxis with heparin subcu. The INR is not therapeutic. After that, we will take it off.  6. Code status: DNR, do not resuscitate.  TOTAL TIME SPENT: 55 minutes.     ____________________________ Hope PigeonVaibhavkumar G. Elisabeth PigeonVachhani, MD vgv:es D: 02/25/2012 07:58:19 ET T: 02/25/2012 08:52:08 ET JOB#: [id number]  cc: Hope PigeonVaibhavkumar G. Elisabeth PigeonVachhani, MD, <Dictator> Knute Neuobert G. Lorre NickGittin, MD Altamese DillingVAIBHAVKUMAR Nalanie Winiecki MD ELECTRONICALLY SIGNED 04/07/2012 17:22

## 2014-06-17 NOTE — Consult Note (Signed)
PATIENT NAME:  [name], [name] MR#:  [id number] DATE OF BIRTH:  1956/02/13  DATE OF CONSULTATION:  02/25/2012  REFERRING PHYSICIAN:  Clydie Braun, MD CONSULTING PHYSICIAN:  Marcina Millard, MD  CHIEF COMPLAINT: Tachycardia.   REASON FOR CONSULTATION: Consultation is requested for evaluation of atrial fibrillation.   HISTORY OF PRESENT ILLNESS: The patient is a 59 year old gentleman admitted with pneumonia and atrial fibrillation with a rapid ventricular rate. The patient has had a history of paroxysmal atrial fibrillation and was recently hospitalized 3 weeks ago for a urinary tract infection at which time he had atrial fibrillation with a rapid ventricular rate which required diltiazem drip. The patient was doing well at home until two nights ago when he experienced a fever. Yesterday he noted his heart was racing and he presented to [facility] Emergency Room where again he was noted to be in atrial fibrillation with a rapid ventricular rate. The patient was given a bolus of intravenous diltiazem which controlled his ventricular rate. Chest x-ray was performed which revealed new bilateral interstitial pulmonary opacities. The patient was also significantly anemic with a hemoglobin and hematocrit of 8 and 24.1, respectively.   PAST MEDICAL HISTORY: 1. Paroxysmal atrial fibrillation.  2. Colon cancer with metastasis to the lung. 3. History of DVT, on warfarin.   MEDICATIONS: 1. Warfarin 2.5 mg daily.  2. Metoprolol tartrate 25 mg 2 times a day. 3. Cardizem CD 300 mg daily.  4. Oxycodone 5 mg q. 6 p.r.n. 5. Tandem 1 daily.  6. Fentanyl transdermal patch every 3 days.  7. Omeprazole 20 mg 2 times a day. 8. Gabapentin 300 mg 3 times day. 9. Stivarga 80 mg daily.  10. Potassium chloride 15 mEq daily.   SOCIAL HISTORY: The patient is married and currently lives in [place]. He denies tobacco abuse.   FAMILY HISTORY: No immediate family history of coronary artery disease or myocardial  infarction.   REVIEW OF SYSTEMS:   CONSTITUTIONAL: The patient did have fever and mild chills.   EYES: No blurry vision.   EARS: No hearing loss.   RESPIRATORY: No shortness of breath.   CARDIOVASCULAR: Tachycardia, as described above.   GASTROINTESTINAL: No nausea, vomiting, diarrhea or constipation.   GENITOURINARY: No dysuria or hematuria.   ENDOCRINE: No polyuria or polydipsia.   INTEGUMENTARY: No rash.   MUSCULOSKELETAL: No arthralgias or myalgias.   NEUROLOGICAL: No focal muscle weakness or numbness.   PSYCHOLOGICAL: No depression or anxiety.  PHYSICAL EXAMINATION:  VITAL SIGNS: Blood pressure 94/60, pulse 114, respirations 18, temperature 100.1 and pulse oximetry 90%.   HEENT: Pupils equal and reactive to light and accommodation.   NECK: Supple without thyromegaly.   PULMONARY: Lungs were clear.   CARDIOVASCULAR Normal JVP. Normal PMI. Irregularly irregular rhythm. Normal S1 and S2. No appreciable gallop, murmur or rub.   ABDOMEN: Soft and nontender.   EXTREMITIES: No cyanosis, clubbing or edema. Pulses were intact bilaterally.   MUSCULOSKELETAL: Normal muscle tone.   NEUROLOGIC: The patient is alert and oriented x 3. Motor and sensory are both grossly intact.   IMPRESSION: This is a 60 year old gentleman with recurrent paroxysmal atrial fibrillation in the setting of possible pneumonia, metastasis to lung, fever and chills, and anemia. The patient currently appears clinically hemodynamically stable. Heart rate is somewhat improved now requiring diltiazem drip.   RECOMMENDATIONS: 1. Continue present medications.  2. May consider up titrating metoprolol pending the patient's heart rate.  3. May consider adding antiarrhythmic if the patient persists in atrial fibrillation. We will  likely weight 24 hours before adding antiarrhythmic.  4. No further cardiac diagnostics at this time.  ____________________________ Marcina MillardAlexander Tiernan Suto, MD ap:sb D: 02/25/2012  09:15:28 ET T: 02/25/2012 09:38:02 ET JOB#: [id number]  cc: Marcina MillardAlexander Genesee Nase, MD, <Dictator> Marcina MillardALEXANDER Keishana Klinger MD ELECTRONICALLY SIGNED 02/26/2012 11:36

## 2014-06-17 NOTE — Consult Note (Signed)
PATIENT NAME:  [name], [name] MR#:  [id number] DATE OF BIRTH:  1955-08-24  DATE OF CONSULTATION:  03/02/2012  CONSULTING PHYSICIAN:  Knute Neuobert G. Gittin, MD  HISTORY OF PRESENT ILLNESS: The patient is a 59 year old patient who is well known to me with a history of colorectal cancer widely metastatic, status post prior colostomy, history that includes recurrent cancer with lung and liver metastasis, retroperitoneal adenopathy and involvement of the lumbar spine. Progression through and failure of multiple systemic therapies and recently underwent palliative radiation therapy to retroperitoneal lymph nodes and spine.  PAST MEDICAL HISTORY: Includes nephrolithiasis and ureteral stenting. Also, early Parkinson's disease and recent atrial fibrillation. Also lower extremity DVT on Coumadin maintenance. Status post a Port-A-Cath placement.   PAST SURGICAL HISTORY: Includes abdominal hernia repair. He has most recently experienced progressive disease, been followed by Hospice, but was also started on targeted therapy with Stivarga started late November. Notably, in recent weeks, he has had some increased back pain, has become less mobile, has had recurrent urinary tract infection, and now on December 30th was admitted with fever that was later attributed to pneumonia, he was in rapid atrial fibrillation which was treated with initially intravenous and higher doses of Cardizem.   HOSPITALIZATIONS: Include antibiotic therapy, fluid support, Cardiology evaluation, echocardiogram, demonstration of aortic insufficiency, evaluation by Infectious Disease for endocarditis. Adjustment of Coumadin. Oxygen support. Recently, he has had improvement in the x-ray and signs that a pneumonia is improving. Also had evidence of congestive heart failure due to atrial fibrillation. He has also been seen by Palliative Care.   PAST HISTORY: As above.   ALLERGIES: Known to PENICILLIN.  SOCIAL HISTORY: Negative for alcohol or tobacco,  and currently followed at home by hospice services.   FAMILY HISTORY: Noncontributory. There is bladder cancer and prostate cancer in the family.  MEDICATION AT THE TIME OF ADMISSION: Iron tablets once a day, Coumadin 2.5 mg daily, Stivarga 80 mg daily, potassium 20 mEq a day, oxycodone 5 mg every 6 hours p.r.n. for pain, omeprazole 20 mg b.i.d., metoprolol 25 mg b.i.d., gabapentin 300 mg t.i.d., fentanyl 25 mcg patches, Cardizem CD 300 mg.   PHYSICAL EXAMINATION:  VITAL SIGNS: At the time of evaluation early this afternoon, he was afebrile and has been afebrile. His heart rate was 90. Blood pressure earlier was in the range of 100/65.  GENERAL: Alert and cooperative with slight pallor. No jaundice.  HEENT: Sclerae clear. No thrush in the mouth.  NECK: No mass in the neck and no palpable adenopathy.  LUNGS: Clear without wheezing or rales.  ABDOMEN: Nontender. No palpable mass or organomegaly. Has an ostomy in place that is unremarkable.  EXTREMITIES:  he has some mild symmetric lower extremity edema. Does not have ecchymosis or petechiae.  NEUROLOGIC: Grossly nonfocal. Moving all extremities against gravity. Was weak, tired. Did not test his gait.   LABORATORY RESULTS: His white count was 5.4 today, hemoglobin 9.9, hematocrit 29, platelets 476. The creatinine 0.9. PT/INR was 2.3. Recently had a sputum with Gram-positive cocci. Blood culture on January 4th was negative. His echocardiogram on January 3rd showed mildly reduced left ventricular function, mildly dilated right ventricle, mild to moderate mitral regurgitation and aortic regurgitation, but no valvular vegetations. His most recent liver function testing on admission showed an albumin of 1.8 and a total protein of 5.9. Chest x-ray showed infiltrates that have improved. He has CT scans of prior documented most recently from December 2nd. Chest CT shows multiple pulmonary parenchymal nodules that increased  compared to prior scans representing  progressive disease. He has known retroperitoneal disease that involved the lumbar spine prior that has been radiated.   He has PET scan from October 17th that also showed retroperitoneal lymph nodes extension to L2 vertebral body as well as the pulmonary lesions. No definite liver metastasis.   IMPRESSION: A patient with known progressive colorectal disease, primarily in the lung retroperitoneum involving the spine with direct extension. He has had some palliative radiation to the back with only some improvement in pain. He has had increased pain medications recently to try and maintain his functionality. He has been losing weight, recurrent infections. He is on salvage therapy with Stivarga taken for approximately 6 weeks. Clinically, he has appearance of continued decline slowly progressive disease as well as compromised immunity, although he has not had any recent CT scan to document or prove progressive disease. He is still very weak and debilitated, although acute infection looks like it is resolved. He is not acutely in heart failure, his vital signs are stable.   PLAN: The patient does not have any other significant anti-neoplastic options and wants to embrace comfort care. It is his and family's, and the wife's desire and very reasonable given his advanced disease, that he embrace continue with hospice care and in fact disposition he might transfer to hospice home appears to be the best option at this point. No other treatment recommendations. He could continue or discontinue Stivarga. It does not look like it is giving him any significant improvement and  not result in any overt side effect. I do not think either his infection or his atrial fibrillation is a result of medication. I will continue to follow him in the hospital.    ____________________________ Knute Neu. Lorre Nick, MD rgg:es D: 03/02/2012 23:56:22 ET T: 03/03/2012 10:19:27 ET JOB#: [id number]  cc: Knute Neu. Lorre Nick, MD,  <Dictator> Marin Roberts MD ELECTRONICALLY SIGNED 04/10/2012 21:14

## 2014-06-19 NOTE — Op Note (Signed)
PATIENT NAME:  [name], [name] MR#:  [id number] DATE OF BIRTH:  [birth year]  DATE OF PROCEDURE:  05/30/2011  PREOPERATIVE DIAGNOSES:  1. Bilateral hydronephrosis.  2. Metastatic colon cancer. 3. Acute renal insufficiency.   POSTOPERATIVE DIAGNOSES:  1. Bilateral hydronephrosis. 2. Metastatic colon cancer. 3. Acute renal insufficiency.  PROCEDURES:  1. Cystoscopy.  2. Bilateral double-J ureteral stent placement.   SURGEON: Assunta GamblesBrian Man Bonneau, MD   ANESTHESIA: Laryngeal mask airway anesthesia.   INDICATIONS: The patient is a 59 year old gentleman with a history of colon cancer. He presented with significant nausea and vomiting. He was found on follow-up CT scan to have metastasis to the chest from the colon cancer. He also has matted adenopathy in the abdomen resulting in bilateral hydronephrosis, right greater than left. He has had subsequent increase in his serum creatinine to 1.7. He presents for bilateral stent placement.   DESCRIPTION OF PROCEDURE: After informed consent was obtained, the patient was taken to the Operating Room and placed in the dorsal lithotomy position under laryngeal mask airway anesthesia. The patient was then prepped and draped in the usual standard fashion. The 22-French rigid cystoscope was introduced into the urethra under direct vision with no urethral abnormalities noted. The prostate fossa demonstrated minimal bilobar hypertrophy with minimal visual obstruction noted. Upon entering the bladder, the mucosa was inspected in its entirety with no gross mucosal lesions noted. Bilateral ureteral orifices were well visualized with no lesions noted. A flexible tip Glidewire was first introduced into the right ureteral orifice. It was easily advanced into the upper pole collecting system under fluoroscopic guidance without difficulty. A 6-French x 26 cm double-J ureteral stent was advanced over the guidewire into the upper pole collecting system without difficulty. A large amount of  urine was noted draining through the stent with placement as well as a moderate amount of debris. Upon withdrawal of the guidewire, adequate curl was noted within the renal pelvis. Adequate curl was also noted within the urinary bladder. The guidewire was then advanced into the left ureteral orifice. It was also advanced into the upper pole collecting system without difficulty. A second 6-French x 26 cm double-J ureteral stent was advanced into the upper pole collecting system without difficulty. Upon withdrawal of the guidewire, adequate curl was noted within the renal pelvis. Adequate curl was also noted within the urinary bladder. The bladder was drained. The cystoscope was removed. The patient was returned to the supine position and awakened from laryngeal mask airway anesthesia. He was taken to the recovery room in stable condition. There were no problems or complications. The patient tolerated the procedure well.    ____________________________ Madolyn FriezeBrian S. Achilles Dunkope, MD bsc:cbb D: 05/31/2011 14:35:23 ET T: 05/31/2011 18:02:45 ET JOB#: [id number]  cc: Madolyn FriezeBrian S. Achilles Dunkope, MD, <Dictator> Madolyn FriezeBRIAN S Jasleen Riepe MD ELECTRONICALLY SIGNED 06/04/2011 7:10

## 2014-06-19 NOTE — Discharge Summary (Signed)
PATIENT NAME:  [name], [name] MR#:  [id number] DATE OF BIRTH:  [birth year]  DATE OF ADMISSION:  05/29/2011 DATE OF DISCHARGE:  05/31/2011  FINAL DIAGNOSES:  1. Nausea and vomiting attributed to hydronephrosis that resolved.  2. 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  3. Old stable deep venous thrombosis.  4. Placement of bilateral ureteral stents.   PROCEDURES: Placement of bilateral ureteral stents.   HISTORY AND PHYSICAL: Dictated on admission.   HOSPITAL COURSE: The patient was admitted with nausea and vomiting and CT findings of increasing hydronephrosis compared to the prior scans in December. He was given intravenous fluids, antiemetics. Urology was consulted. He was taken for placement of bilateral ureteral stents, which along with hydration dramatically improved his elevated creatinine. He had some slight hematuria post procedure, unremarkable. The patient was maintained on his Coumadin. He had no other complications and on 05/31/2011 was stable for discharge. Exam was stable, nausea and vomiting was relieved. He had scant hematuria. Creatinine had normalized. He was discharged on omeprazole 20 mg daily, stool softeners daily, and Coumadin 5 mg on Saturdays and 7.5 mg on Sundays, with re-evaluation and new dose to be given with a pro time check in 48 hours, he was prior on 7.5 mg six days a week and 5 mg on Saturday, which was his old dose. He was on a regular diet and to have no exertions and his appointment was on 06/03/2011 in the Cancer Center with Dr. Lorre NickGittin for labs and [facility]  ____________________________ Knute Neuobert G. Lorre NickGittin, MD rgg:slb D: 06/10/2011 09:55:40 ET T: 06/10/2011 15:20:19 ET JOB#: [id number]  cc: Knute Neuobert G. Lorre NickGittin, MD, <Dictator> Marin RobertsOBERT G GITTIN MD ELECTRONICALLY SIGNED 06/12/2011 12:40

## 2014-06-19 NOTE — H&P (Signed)
PATIENT NAME:  [name] MR#:  [id number] DATE OF BIRTH:  1955/09/06  DATE OF ADMISSION:  05/29/2011  REFERRING PHYSICIAN: Dr. Manson PasseyBrown PRIMARY CARE PHYSICIAN: Dr. Lorre NickGittin   PRESENTING COMPLAINT: Nausea, vomiting, low back pain.   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 intake, unable to keep any food down or liquid due to his symptoms. Reports abdominal pain around the stoma. He has been having intermittent diarrhea and constipation. No blood. He also endorsed low back pain. Denies any chest pain, shortness of breath. No orthopnea or PND. No worsening lower extremity edema. Patient had his last chemotherapy on 05/23/2011.   PAST MEDICAL HISTORY:  1. Metastatic rectosigmoid adenocarcinoma diagnosed in November 2005 with perforation proximal to diverticulum status post Gertie GowdaHartmann procedure and colostomy with attempt for reversal but ended up relocating the colostomy in December 2012.  2. Recurrent cancer diagnosed June 2009.  3. Left lower extremity deep vein thrombosis in August 2012.  4. Partial small bowel obstruction.  5. Nephrolithiasis.  6. Parkinson's disease.   PAST SURGICAL HISTORY:  1. Port-A-Cath placement.  2. Abdominal hernia repair.  3. Bunionectomy.  4. Wisdom teeth extraction.  5. Hartmann procedure as above and relocation of colostomy.   ALLERGIES: Penicillin.   MEDICATIONS:  1. Stool softener.  2. Omeprazole 20 mg daily.  3. Coumadin 7.5 mg on Tuesday, Wednesday, Thursday, Friday, Saturday, Sunday and 5 mg on Monday.  4. Aspirin 81 mg daily.   FAMILY HISTORY: Father with prostate cancer. Uncle with bladder  cancer. Mother with thyroid disease. Paternal uncle with brain cancer and another paternal uncle with laryngeal cancer. Another paternal uncle with Parkinson's disease.   SOCIAL HISTORY: Lives in [place] with his wife. Denies any tobacco, alcohol or drug use.    REVIEW OF SYSTEMS: CONSTITUTIONAL: No fevers. Endorses nausea, vomiting. EYES: No cataracts. ENT: No ear pain, epistaxis, discharge. RESPIRATORY: No cough, wheezing, hemoptysis. CARDIOVASCULAR: No chest pain, worsening edema, palpitations, syncope. GASTROINTESTINAL: Endorses nausea, vomiting, intermittent diarrhea, constipation and pain around the stoma. GENITOURINARY: No dysuria, hematuria. ENDO: No polyuria, polydipsia. HEMATOLOGIC: No easy bleeding. SKIN: No ulcers. MUSCULOSKELETAL: No joint swelling. NEUROLOGIC: No one-sided weakness or numbness. PSYCH: Denies any suicidal ideation.   PHYSICAL EXAMINATION:  VITAL SIGNS: Temperature 97.6, pulse 82, respiratory rate 18, blood pressure 154/88, sating 97% on room air.   GENERAL: Lying in bed in no apparent distress.   HEENT: Normocephalic, atraumatic. Pupils equal and symmetric, nonicteric. Nares without discharge. She still has moist mucous membranes.   NECK: Soft and supple. No adenopathy or JVP.   CARDIOVASCULAR: Non-tachy. No murmurs, rubs, or gallops.   LUNGS: Clear to auscultation bilaterally. No use of accessory muscles or increased respiratory effort.   ABDOMEN: Soft. Mild tenderness around the stoma but no bleeding or drainage. No stool in bag.    EXTREMITIES: Trace edema of the left lower extremity greater than right. Dorsal pedis pulses intact.   MUSCULOSKELETAL: No joint effusion.   SKIN: No ulcers.   NEUROLOGIC: No dysarthria or aphasia. Symmetrical strength. He has mild tremor right hand greater than left.   PSYCH: He is alert and oriented. Patient is cooperative.   LABORATORY, DIAGNOSTIC AND RADIOLOGICAL DATA: WBC 4.7, hemoglobin 11.8, hematocrit 35.3,  platelet 258, MCV 76, glucose 107, BUN 16, creatinine 1.18, sodium 144, potassium 4.8, chloride 105, carbon dioxide 30, calcium 9.2. LFTs within normal limits. Troponin less than 0.02. EKG with sinus rate of 70. No ST elevation or depression. There is T wave inversion in aVR and V1. CT scan of the abdomen and pelvis shows indeterminate pulmonary nodules in lung bases. Largest lesion in the left lower lobe measuring 1.5 cm in maximum transverse dimension. Given the patient's history of malignancies are worrisome for metastatic disease. There is mild hydronephrosis bilaterally. Obstruction may be secondary to mass anterior to the aortic bifurcation. Mass contains a central calcification. May represent retroperitoneal fibrosis. There are small bilateral inguinal hernias containing fat. Ventral pelvic wall hernia repair. Nonobstructing stones in the left kidney. There is left upper quadrant colostomy. Small probable cyst in the left hepatic lobe.   ASSESSMENT AND PLAN: [name] is a pleasant 59 year old gentleman with history of metastatic rectosigmoid adenocarcinoma, left deep venous thrombosis, Parkinson's disease, nephrolithiasis presenting with nausea, vomiting, poor p.o. intake, intermittent diarrhea, constipation, low back pain.  1. Intractable nausea, vomiting, likely chemotherapy related more severe than his typical symptoms. Admit for symptom control. CT of abdomen and pelvis reading as dictated above. Attempted to compared to previous CT scan in November 2012 and December 2012; cannot assess if he has new pulmonary nodules or if he has progression in the size of this mass. Per patient he had a PET scan greater than two years ago. Will have Dr. Lorre Nick evaluate scan and determine if repeat PET scan is warranted. Will send a CEA. His last CEA was on 03/21 and that was 15.1.  2. History of left lower extremity deep venous thrombosis. INR is not therapeutic. Will increase his Coumadin regimen.   3. Hypertension, likely in the setting of #1. Continue to follow.  4. Microcytic anemia, stable.  5. Prophylaxis with Coumadin, aspirin and omeprazole.   TIME SPENT: Approximately 50 minutes spent on patient care.   ____________________________ Reuel Derby, MD ap:cms D: 05/29/2011 07:20:47 ET T: 05/29/2011 08:25:41 ET JOB#: [id number]  cc: Pearlean Brownie Tayley Mudrick, MD, <Dictator> Knute Neu. Lorre Nick, MD Reuel Derby MD ELECTRONICALLY SIGNED 06/23/2011 1:54

## 2014-06-19 NOTE — Consult Note (Signed)
Pt Seen, Chart Reviewed, Films Reviewed, Note Dictated  Bilateral Hydronephrosis, Nephrolithiasis, Acute Renal Failure, Nausea/Vomiting  The degree of Hydronephrosis is fairly significant on the right with mild on the left. Serum Cr rise to 1.7.  Could be dehydration from N/V.  The N/V itself can come from the obstruction. With the XRT, Nodes, Hazy retroperitoneal changes, stent placement is recommended ASAP.  With the bilateral changes, bilateral stent placement is recommended. Discussed with Pt and wife.  Pt consented for the procedure.  Agrees to proceed.  Electronic Signatures: Smith Robertope, Jolicia Delira S (MD)  (Signed on 04-Apr-13 12:28)  Authored  Last Updated: 04-Apr-13 12:28 by Smith Robertope, Colson Barco S (MD)

## 2014-06-19 NOTE — Consult Note (Signed)
Pt doing much better after stent placement Some slight hematuria and slight flank pain N/V better Cr much improved. Ok for discharge when ready from your standpoint. F/U 3 weeks to be scheduled.  Electronic Signatures: Smith Robertope, Keslie Gritz S (MD)  (Signed on 05-Apr-13 13:27)  Authored  Last Updated: 05-Apr-13 13:27 by Smith Robertope, Tahesha Skeet S (MD)

## 2014-06-19 NOTE — Consult Note (Signed)
PATIENT NAME:  [name], [name] MR#:  [id number] DATE OF BIRTH:  [birth year]  DATE OF CONSULTATION:  05/29/2011  REFERRING PHYSICIAN:   CONSULTING PHYSICIAN:  Knute Neuobert G. Lorre NickGittin, MD  HISTORY OF PRESENT ILLNESS: [name] is a 59 year old man well known to me. He was admitted the morning of April 03 and seen by me in the early evening of April 03. He was admitted with three days of nausea, vomiting, postprandial vomiting, decreased p.o. intake. He had constipation but has moved his bowels since admission. He had some abdominal discomfort around the stoma but denied significant pain or cramps. He also had low back pain and he reported that this improved after moving his bowels. He did not have any constitutional or focal complaints or any nausea after his recent chemotherapy which was on 03/28. He was admitted and started on intravenous fluids. As he had bowel movement later on the CT did not show any obstruction. He was also given clear liquid diet. He was continued on his regular Coumadin. He was given intravenous fluids and Zosyn for nausea. He was noted to have CT evidence of some progressive pulmonary nodules compared to the December scan. He also has what is reported to be some increasing hydronephrosis but his serum creatinine was unremarkable.   History includes metastatic rectosigmoid cancer initially diagnosed back in 2005. He has been on and off therapy, recently on chemotherapy with FOLFIRI with dose adjustments.   PAST MEDICAL HISTORY:  1. Bevacizumab. He developed a blood clot while on bevacizumab.  2. He has deep vein thrombosis; he is on Coumadin maintenance.  3. He has had prior partial small bowel obstruction.  4. He has had surgery for narrowed small bowel segment and revision of his ostomy.  5. He has had history of kidney stones.  6. He has Port-A-Cath placement. 7. Repair of abdominal hernia.  8. He has also had bunion surgery. 9. Wisdom teeth in the past.   ALLERGIES: He has an  allergy to penicillin.   MEDICATIONS AT TIME OF ADMISSION:  1. Coumadin 5 mg Mondays and 7.5 mg six days a week.  2. Aspirin 81 mg. 3. Stool softeners daily.  4. Omeprazole 20 mg daily.   FAMILY HISTORY: Noncontributory but has bladder history and prostate cancer in the family and laryngeal cancer.   SOCIAL HISTORY: No alcohol, no tobacco.   REVIEW OF SYSTEMS: GI symptoms as above and bowel habits as noted above. Otherwise, he has not had, did not have at time of admission, did not have when I saw him any headache or dizziness visual disturbances, ear or jaw pain. He denied palpitations, retrosternal chest pain, orthopnea, PND. No increasing edema. No palpitations. No current abdominal pain. No dysuria, hematuria. No polyuria or polydipsia. No hot or cold intolerance. No significant bruising. No rashes. No bone or joint pain. He has had the mid to low back pain that waxes and wanes, seems to remit after he moves his bowels. No focal weakness. No anxiety.   PHYSICAL EXAMINATION:  GENERAL: He is alert and cooperative.   NEUROLOGIC: Grossly nonfocal.    MUSCULOSKELETAL: Moving all extremities against gravity. I did not test his gait.   MOUTH: No thrush.   NECK: No mass in the neck.   LYMPH: No palpable nodes in the neck, supraclavicular, submandibular, submaxilla.   LUNGS: Clear. No wheezing, rales or rhonchi.   HEART: Regular.   ABDOMEN: Nontender. No palpable mass or organomegaly. Stoma is unremarkable.   EXTREMITIES: No significant  extremity edema, trace edema of the left, diameter is minimally increased compared to the opposite side which has been noted previously below the knee with no calf tenderness. No rash. No bruising.     PSYCH: Alert and oriented. Affect unremarkable.   LABORATORY, DIAGNOSTIC, AND RADIOLOGICAL DATA: Labs on admission: Hemoglobin 11.8, white count 4.7, platelets 258, creatinine 1.18. Liver functions normal. Troponin low. EKG unremarkable. CT of the  abdomen and pelvis showed pulmonary nodules in the bases. I reviewed these scans with Dr. Rosalene Billings. They are increased from the December scan. Notably patient's highest CEA was when the treatment was interrupted, the CEA was at maximal in early February, chemotherapy resumed in late January. Scan also shows nodal mass anterior to the aortic bifurcation which is unchanged from previous findings. There is also bilateral hydronephrosis which has developed since December with some hydroureter on the right.   IMPRESSION: Nausea, vomiting has already resolved. No sign of obstruction. Bowel movement good. Exam was benign of the abdomen including he had normal active bowel sounds. He is already tolerating a liquid diet. He has no fever. There is no bleeding. His Coumadin is therapeutic. CBC looks good. Liver functions look good. He has probably progressive small volume disease in the lungs although he was not rescanned exact at time of maximal CEA so from CEA criteria he may still be having some stable disease from his most recent chemotherapy. The possible retroperitoneal fibrosis or obstruction from an aortic mass is of concern. The kidney function is still good. Hydronephrosis that is new is seen.   PLAN: Continue IV fluids. Continue to watch the electrolytes and the CBC. Will get a nephrology consultation and may want an ultrasound. He may be a candidate either for observation or ultimately stenting of one or both sides. Will monitor the CEA. I will make a decision about continued chemotherapy with current regimen or later salvage treatment. Okay to advance his diet. If he has any recurrent vomiting would repeat abdominal x-rays and would consult surgery.   ____________________________ Knute Neu Lorre Nick, MD rgg:cms D: 05/30/2011 00:29:00 ET T: 05/30/2011 10:14:10 ET JOB#: [id number]  cc: Knute Neu. Lorre Nick, MD, <Dictator> Marin Roberts MD ELECTRONICALLY SIGNED 05/31/2011 17:15

## 2014-06-19 NOTE — Discharge Summary (Signed)
PATIENT NAME:  [name], [name] MR#:  [id number] DATE OF BIRTH:  1955-02-28  DATE OF ADMISSION:  02/13/2011 DATE OF DISCHARGE:  02/17/2011  DISCHARGE DIAGNOSES:  1. Ventral hernia. 2. Previous history of malignancy of the large intestine. 3. Previous lower extremity deep vein thrombosis.   CLINICAL NOTE: This 59 year old male has had recurrent episodes of abdominal pain and has developed a recurrent parastomal hernia in spite of previous reinforcement with Gore-Tex mesh. CT scan suggested a prominent loop of bowel in the pelvis, and he was felt to be a candidate for exploration and repositioning of his stoma.   HOSPITAL COURSE: The patient was taken to the operating room on the day of admission at which time he underwent lysis of adhesions, small bowel resection with primary anastomosis, removal of the previously placed Gore-Tex mesh, and repositioning the stoma in the left upper quadrant with reinforcement with lightweight polypropylene mesh. The patient's postoperative course was unremarkable. He had been maintained on Lovenox prior to the procedure and this was restarted within 24 hours of surgery. He tolerated this without ill mass effect. Coumadin was begun prior to discharge pending cessation of the Lovenox when his pro time was adequate. He ambulated early and often. He showed good pulmonary function. He used his incentive spirometer aggressively as requested.   The patient's old stoma site was healing well by secondary intent at the time of discharge and his primary wound showed no abnormality. Of note, a wound VAC had been placed over the primary wound and the old stoma site at the time of surgery.   Pathology of the appendix, which was removed incidentally, was unremarkable. The small bowel resected in the pelvis showed moderate active enteritis with architectural features of chronicity and focal intramural abscess formation and thickening of the muscularis propria with stromal atypia was noted.  This was felt to be compatible with previous radiation. The resected stoma was unremarkable.   The patient was discharged home making use of Lovenox 80 mg twice a day. He will resume his previous 7.5 mg dose of Coumadin as well. Home-going instructions were provided.   Arrangements have been made for outpatient follow-up in my office in approximately two weeks. ____________________________ Jay MayotteJeffrey Baird. Keyani Rigdon, MD [id number] D: 03/12/2011 12:34:34 ET T: 03/13/2011 08:49:23 ET JOB#: [id number]  cc: Jay MayotteJeffrey Baird. Jay Chizmar, MD, <Dictator> Knute Neuobert G. Lorre NickGittin, MD Jay Merkley Brion AlimentW Arlesia Kiel MD ELECTRONICALLY SIGNED 03/14/2011 15:25
